# Patient Record
Sex: Male | Born: 1960 | Race: White | Hispanic: No | Marital: Married | Smoking: Former smoker
Health system: Southern US, Community
[De-identification: ages and names within clinical notes are randomized; demographics above are authoritative.]

## PROBLEM LIST (undated history)

## (undated) DIAGNOSIS — G510 Bell's palsy: Secondary | ICD-10-CM

## (undated) DIAGNOSIS — R519 Headache, unspecified: Secondary | ICD-10-CM

## (undated) DIAGNOSIS — K219 Gastro-esophageal reflux disease without esophagitis: Secondary | ICD-10-CM

## (undated) DIAGNOSIS — R51 Headache: Secondary | ICD-10-CM

## (undated) HISTORY — PX: OTHER SURGICAL HISTORY: SHX169

## (undated) HISTORY — DX: Headache: R51

## (undated) HISTORY — PX: CHOLECYSTECTOMY: SHX55

## (undated) HISTORY — DX: Gastro-esophageal reflux disease without esophagitis: K21.9

## (undated) HISTORY — DX: Bell's palsy: G51.0

## (undated) HISTORY — DX: Headache, unspecified: R51.9

---

## 1995-05-13 HISTORY — PX: BACK SURGERY: SHX140

## 2006-07-16 ENCOUNTER — Encounter: Admission: RE | Admit: 2006-07-16 | Discharge: 2006-07-16 | Payer: Self-pay | Admitting: Orthopaedic Surgery

## 2007-05-13 HISTORY — PX: MEDIAL COLLATERAL LIGAMENT REPAIR, KNEE: SHX2019

## 2011-01-02 ENCOUNTER — Ambulatory Visit: Payer: Self-pay | Admitting: Family Medicine

## 2011-01-07 ENCOUNTER — Ambulatory Visit: Payer: Self-pay | Admitting: Gastroenterology

## 2011-01-16 ENCOUNTER — Telehealth (INDEPENDENT_AMBULATORY_CARE_PROVIDER_SITE_OTHER): Payer: Self-pay

## 2011-01-16 NOTE — Telephone Encounter (Signed)
LMOM and LM at work for pt to call our office so he can get his new appt that I made for him to see DR Dwain Sarna instead of Dr Luisa Hart.[name]

## 2011-01-20 ENCOUNTER — Telehealth (INDEPENDENT_AMBULATORY_CARE_PROVIDER_SITE_OTHER): Payer: Self-pay | Admitting: General Surgery

## 2011-01-20 NOTE — Telephone Encounter (Signed)
Contacted patient and left a message on voice mail. Need to know where he had his hida scan so we can get the report for his office visit.

## 2011-01-21 ENCOUNTER — Encounter (INDEPENDENT_AMBULATORY_CARE_PROVIDER_SITE_OTHER): Payer: Self-pay | Admitting: General Surgery

## 2011-01-21 ENCOUNTER — Ambulatory Visit (INDEPENDENT_AMBULATORY_CARE_PROVIDER_SITE_OTHER): Payer: 59 | Admitting: General Surgery

## 2011-01-21 VITALS — BP 106/88 | HR 60 | Temp 98.0°F | Ht 70.0 in | Wt 186.4 lb

## 2011-01-21 DIAGNOSIS — K828 Other specified diseases of gallbladder: Secondary | ICD-10-CM | POA: Insufficient documentation

## 2011-01-21 NOTE — Progress Notes (Signed)
Chief Complaint  Patient presents with  . Other    eval gallbladder    HPI Martin Hodge is a 50 y.o. male.   HPI This is a 49 year old male who is a relative of Dr. Luisa Hart area he is otherwise very healthy and about a couple of weeks ago again having symptoms of sweats, chills, decreased energy and low-grade fevers. He began taking Tylenol after his symptoms started. He had been nauseated and having epigastric pain that radiated into his chest as well as into his right upper quadrant around his flank. Eventually his fever broke but he continued to have no abdominal pain. He describes very dark urine as well as a yellowish discoloration of his eyes. He was then evaluated by his primary care physician. He underwent a KUB which was negative as well as a urinalysis that was negative. His ultrasound room showed no real abnormalities. His laboratory evaluation however showed him to have an albumin of 4, alkaline phosphatase of 344, total bilirubin of 4.9, AST of 215, and ALT of 499. At that point he was referred to see a gastroenterologist. His repeat labs were all improved with an alkaline phosphatase of 395, total bilirubin of 1.9, AST of 114, and ALT of 414. His symptoms were slowly improving at that point to where now he is fairly normal without any of these symptoms again. He also underwent a HIDA scan which showed him to have really normal anatomy with an ejection fraction and measured below normal at 24%. Within the injection he did not have recurrence of any of his symptoms at that point. Apparently his hepatitis serologies have all been negative also. He comes in today for evaluation to consider a cholecystectomy. He does describe that he hasn't had occasional pain previously as well as what he thinks may have been some yellowish discoloration of his eyes as well. He has no other symptoms including weight loss. 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 He drinks alcohol but rarely.Past  Medical History  Diagnosis Date  . GERD (gastroesophageal reflux disease)   . Cough     Past Surgical History  Procedure Date  . Medial collateral ligament repair, knee 2009  . Back surgery 1997    disk surgery in lower back     Family History  Problem Relation Age of Onset  . Stroke Mother     Social History History  Substance Use Topics  . Smoking status: Former Smoker    Quit date: 05/23/1983  . Smokeless tobacco: Not on file  . Alcohol Use: Yes    Allergies  Allergen Reactions  . Sulfa Antibiotics     Hives, itching     Current Outpatient Prescriptions  Medication Sig Dispense Refill  . Multiple Vitamin (MULTIVITAMIN PO) Take by mouth daily.        . Saw Palmetto, Serenoa repens, (SAW PALMETTO PO) Take by mouth daily.        . cephALEXin (KEFLEX) 500 MG capsule       . predniSONE (STERAPRED UNI-PAK) 10 MG tablet       . sulfamethoxazole-trimethoprim (BACTRIM DS) 800-160 MG per tablet       . tobramycin-dexamethasone (TOBRADEX) ophthalmic solution         Review of Systems Review of Systems  Constitutional: Positive for fever, chills and fatigue.  HENT: Negative.   Eyes: Negative.   Respiratory: Positive for cough.   Cardiovascular: Negative.   Gastrointestinal: Positive for constipation.  Genitourinary: Negative.  Musculoskeletal: Negative.   Neurological: Negative.   Hematological: Negative.   Psychiatric/Behavioral: Negative.     Blood pressure 106/88, pulse 60, temperature 98 F (36.7 C), height 5\' 10"  (1.778 m), weight 186 lb 6 oz (84.539 kg).  Physical Exam Physical Exam  Constitutional: He appears well-developed and well-nourished.  Eyes: No scleral icterus.  Cardiovascular: Normal rate, regular rhythm and normal heart sounds.   Pulmonary/Chest: Effort normal and breath sounds normal. He has no wheezes. He has no rales.  Abdominal: There is no hepatomegaly. There is no tenderness. A hernia (small reducible umbilical hernia) is present.    Lymphadenopathy:    He has no cervical adenopathy.      Assessment    Likely biliary disease, biliary dyskinesia on hida scan    Plan    I reviewed his ultrasound, and evaluation by gastroenterology, and HIDA scan. When I go over his clinical course as well as review his laboratory evaluation it does appear that he has some biliary tract disease it is most consistent with stone disease. He is a hepatitis serology has been negative and there is no medical cause for him having an elevation in his bilirubin and the remainder of his enzymes. He does have a normal ultrasound but does have an abnormal HIDA scan. I discussed with him the option of observation but I think with her clinical course that he is undergone even though he has a normal ultrasound I think the most reasonable plan would be to proceed with a cholecystectomy. We did discuss the possibility of an MRCP but I think if this was negative we would still proceed with a cholecystectomy. He has no other real reason why he would have an elevation of his laboratory markers.  I discussed with him a laparoscopic cholecystectomy with a cholangiogram. We discussed the risks being but not limited to bleeding, infection, open procedure, and common bile duct injury we discussed his work restrictions as well as his long-term restrictions as well. I did specifically discussed with him there may be additional procedures by gastroenterology postoperatively as well as failure to prevent this these episodes or any pain in the future.       Martin Hodge 01/21/2011, 10:52 AM

## 2011-01-27 ENCOUNTER — Encounter (HOSPITAL_COMMUNITY)
Admission: RE | Admit: 2011-01-27 | Discharge: 2011-01-27 | Disposition: A | Discharge status: Home / Self Care | Payer: 59 | Source: Ambulatory Visit | Attending: General Surgery | Admitting: General Surgery

## 2011-01-27 ENCOUNTER — Other Ambulatory Visit (INDEPENDENT_AMBULATORY_CARE_PROVIDER_SITE_OTHER): Payer: Self-pay | Admitting: General Surgery

## 2011-01-27 DIAGNOSIS — K828 Other specified diseases of gallbladder: Secondary | ICD-10-CM

## 2011-01-27 LAB — DIFFERENTIAL
Basophils Absolute: 0 10*3/uL (ref 0.0–0.1)
Basophils Relative: 0 % (ref 0–1)
Eosinophils Relative: 4 % (ref 0–5)
Lymphocytes Relative: 28 % (ref 12–46)
Monocytes Absolute: 0.4 10*3/uL (ref 0.1–1.0)
Neutro Abs: 3.1 10*3/uL (ref 1.7–7.7)

## 2011-01-27 LAB — COMPREHENSIVE METABOLIC PANEL
AST: 52 U/L — ABNORMAL HIGH (ref 0–37)
GFR calc Af Amer: >60 mL/min (ref >60)
Glucose, Bld: 92 mg/dL (ref 70–99)
Sodium: 140 mEq/L (ref 135–145)
Total Protein: 7 g/dL (ref 6.0–8.3)

## 2011-01-27 LAB — CBC
Hemoglobin: 14.5 g/dL (ref 13.0–17.0)
MCHC: 34.3 g/dL (ref 30.0–36.0)
MCV: 86.9 fL (ref 78.0–100.0)
Platelets: 196 10*3/uL (ref 150–400)
RBC: 4.87 MIL/uL (ref 4.22–5.81)
WBC: 5.3 10*3/uL (ref 4.0–10.5)

## 2011-01-28 ENCOUNTER — Ambulatory Visit (INDEPENDENT_AMBULATORY_CARE_PROVIDER_SITE_OTHER): Payer: Self-pay | Admitting: Surgery

## 2011-02-07 ENCOUNTER — Other Ambulatory Visit (INDEPENDENT_AMBULATORY_CARE_PROVIDER_SITE_OTHER): Payer: Self-pay | Admitting: General Surgery

## 2011-02-07 ENCOUNTER — Ambulatory Visit (HOSPITAL_COMMUNITY): Payer: 59

## 2011-02-07 ENCOUNTER — Ambulatory Visit (HOSPITAL_COMMUNITY)
Admission: RE | Admit: 2011-02-07 | Discharge: 2011-02-07 | Disposition: A | Discharge status: Home / Self Care | Payer: 59 | Source: Ambulatory Visit | Attending: General Surgery | Admitting: General Surgery

## 2011-02-07 DIAGNOSIS — K429 Umbilical hernia without obstruction or gangrene: Secondary | ICD-10-CM | POA: Insufficient documentation

## 2011-02-07 DIAGNOSIS — K811 Chronic cholecystitis: Secondary | ICD-10-CM

## 2011-02-07 DIAGNOSIS — F17200 Nicotine dependence, unspecified, uncomplicated: Secondary | ICD-10-CM | POA: Insufficient documentation

## 2011-02-07 NOTE — Op Note (Signed)
NAME:  GLENDA, KUNST NO.:  [id number]  MEDICAL RECORD NO.:  [id number]  LOCATION:  [facility]                         FACILITY:  [facility]  PHYSICIAN:  Juanetta Gosling, MDDATE OF BIRTH:  [birth year]  DATE OF PROCEDURE:  02/07/2011 DATE OF DISCHARGE:                              OPERATIVE REPORT   PREOPERATIVE DIAGNOSIS:  Biliary dyskinesia.  POSTOPERATIVE DIAGNOSIS:  Chronic cholecystitis.  PROCEDURE: 1. Laparoscopic cholecystectomy with a cholangiogram. 2. Primary umbilical hernia repair with suture.  SURGEON:  Juanetta Gosling, MD  ASSISTANT:  None.  ANESTHESIA:  General.  SUPERVISING ANESTHESIOLOGIST:  Maren Beach, MD  SPECIMENS:  Gallbladder contents to pathology.  BLOOD LOSS:  Minimal.  DRAINS:  None.  COMPLICATIONS:  None.  DISPOSITION:  To recovery room in stable condition.  INDICATIONS:  A 50 year old male who has had some history of sweats, chills, decreased energy, low grade fevers, have been nauseated and also had some epigastric pain that radiated into his chest as well as in his right upper quadrant around his flank.  He at that point had an elevated bilirubin also.  He was evaluated by Gastroenterology, had a fairly normal ultrasound, but I had HIDA scan which showed normal anatomy and ejection fraction measured below normal at 24%.  His hepatitis serologies were negative per the report from the gastroenterologist and he was evaluated then by me to see if it does consider a cholecystectomy, given what may very well have been some symptoms from his gallbladder, even though he did not appear to have any stones.  He and I discussed multiple options and including observation, but I think without another compelling reason for his bilirubin being still elevated and the pain that he had, it was reasonable to proceed with laparoscopic cholecystectomy with a cholangiogram.  PROCEDURE:  After informed consent was obtained from the  patient, he was then given 1 gram of intravenous cefoxitin.  Sequential compression devices were placed on lower extremities prior to induction with anesthesia.  He was then placed under general endotracheal anesthesia without complications.  His abdomen was prepped and draped in standard sterile surgical fashion.  A surgical time-out was then performed.  I then infiltrated 0.25% Marcaine below his umbilicus.  I made a vertical incision, carried this down to his fascia.  This was entered sharply.  His peritoneum was entered with Metzenbaum scissors.  I then placed a 0 Vicryl pursestring suture through the fascia.  A Hasson trocar was introduced and insufflated 15 mmHg pressure.  Three further 5- mm ports were placed in the epigastrium right upper quadrant under direct vision after infiltration with local anesthetic without complication.  I then retracted his gallbladder cephalad.  He had a fair amount of adhesions from his duodenum to his gallbladder and these were taken down bluntly.  Eventually, I was able to retract his gallbladder cephalad and lateral.  It did show signs that it looked like he had chronic cholecystitis.  I then dissected triangle of Calot and clearly identified both the cystic duct and cystic artery obtaining the critical view of safety.  I clipped the artery and divided this.  I then clipped the duct distally, made  a ductotomy.  I milked this.  There were no stones present.  I then introduced a Cook catheter and performed a cholangiogram.  Cholangiogram showed filling of the duodenum, filling of both sides of the liver, my presence in the cystic duct and no stones that appeared to be present.  I then removed the catheter.  I clipped the duct twice and divided.  Then, I removed the gallbladder from the liver bed.  This was very adherent to his liver bed in numerous positions.  I did make a small hole in his gallbladder and it was filled with small amount of bile  during this.  The gallbladder eventually was removed.  I then placed an EndoCatch bag and removed it from the umbilicus.  Copious irrigation was performed until this was clear.  I then obtained hemostasis.  I did place a piece of snow on his gallbladder bed as it was a little friable and then evacuated all the fluid.  I then removed the Hasson trocar.  He did have a small reducible umbilical hernia preoperatively, so I lifted up his umbilicus and then had about a 3-mm umbilical hernia that I fixed with a figure-of-eight Ethibond suture.  I then tacked his umbilicus down with a 3-0 undyed Vicryl.  I looked through my epigastric port and there was no entry injury that was present.  I then desufflated the abdomen and removed all ports.  I then closed these incisions with 4-0 Monocryl and Dermabond. He tolerated this well, was extubated and transferred to recovery room in stable condition.     Juanetta Gosling, MD     MCW/MEDQ  D:  02/07/2011  T:  02/07/2011  Job:  [id number]  cc:   Jerl Mina, MD  Electronically Signed by Emelia Loron MD on 02/07/2011 05:30:49 PM

## 2011-02-10 ENCOUNTER — Other Ambulatory Visit (INDEPENDENT_AMBULATORY_CARE_PROVIDER_SITE_OTHER): Payer: Self-pay | Admitting: General Surgery

## 2011-02-10 DIAGNOSIS — R9389 Abnormal findings on diagnostic imaging of other specified body structures: Secondary | ICD-10-CM

## 2011-02-13 ENCOUNTER — Telehealth (INDEPENDENT_AMBULATORY_CARE_PROVIDER_SITE_OTHER): Payer: Self-pay | Admitting: General Surgery

## 2011-02-13 ENCOUNTER — Other Ambulatory Visit (INDEPENDENT_AMBULATORY_CARE_PROVIDER_SITE_OTHER): Payer: Self-pay | Admitting: General Surgery

## 2011-02-13 ENCOUNTER — Ambulatory Visit (HOSPITAL_COMMUNITY)
Admission: RE | Admit: 2011-02-13 | Discharge: 2011-02-13 | Disposition: A | Discharge status: Home / Self Care | Payer: 59 | Source: Ambulatory Visit | Attending: General Surgery | Admitting: General Surgery

## 2011-02-13 ENCOUNTER — Encounter (HOSPITAL_COMMUNITY): Payer: Self-pay

## 2011-02-13 DIAGNOSIS — R9389 Abnormal findings on diagnostic imaging of other specified body structures: Secondary | ICD-10-CM

## 2011-02-13 MED ORDER — IOHEXOL 300 MG/ML  SOLN
80.0000 mL | Freq: Once | INTRAMUSCULAR | Status: AC | PRN
  Administered 2011-02-13: 80 mL via INTRAVENOUS

## 2011-02-13 NOTE — Telephone Encounter (Signed)
Call received from Kim at [facility] radiology [phone], order needs to be changed to with contrast. Changed order

## 2011-02-14 ENCOUNTER — Telehealth (INDEPENDENT_AMBULATORY_CARE_PROVIDER_SITE_OTHER): Payer: Self-pay

## 2011-02-14 NOTE — Telephone Encounter (Signed)
Called pt to notify him that his tests are all normal per Dr Dwain Sarna.[name]

## 2011-02-24 ENCOUNTER — Encounter (INDEPENDENT_AMBULATORY_CARE_PROVIDER_SITE_OTHER): Payer: Self-pay | Admitting: General Surgery

## 2011-02-24 ENCOUNTER — Ambulatory Visit (INDEPENDENT_AMBULATORY_CARE_PROVIDER_SITE_OTHER): Payer: 59 | Admitting: General Surgery

## 2011-02-24 VITALS — BP 122/78 | HR 66 | Temp 97.2°F | Resp 16 | Ht 70.0 in | Wt 190.0 lb

## 2011-02-24 DIAGNOSIS — Z09 Encounter for follow-up examination after completed treatment for conditions other than malignant neoplasm: Secondary | ICD-10-CM

## 2011-02-24 NOTE — Progress Notes (Signed)
Subjective:     Patient ID: JOAB CARDEN, male   DOB: 1960/07/22, 51 y.o.   MRN: [id number]  HPI This is a 50 year old male who I saw for biliary dyskinesia and history of elevated liver function tests who we did a laparoscopic cholecystectomy with a normal cholangiogram on recently. He has done well postoperatively he returns today without any significant complaints. I also did a primary umbilical hernia repair at the same time. He had an abnormal chest x-ray preoperatively which was followed by a chest CT scan which was negative.  Review of Systems *RADIOLOGY REPORT*  Clinical Data: Right upper lobe pulmonary nodule seen on chest  radiograph. Productive cough.  CT CHEST WITH CONTRAST  Technique: Multidetector CT imaging of the chest was performed  following the standard protocol during bolus administration of  intravenous contrast.  Contrast: 80mL OMNIPAQUE IOHEXOL 300 MG/ML IV SOLN  Comparison: Chest radiograph on 01/27/2011  Findings: No suspicious pulmonary nodules or masses are seen in the  right upper lobe or elsewhere within either lung. There is no  evidence of pulmonary infiltrate or endobronchial lesion. Mild  biapical scarring noted. No evidence of pleural or pericardial  effusion.  There is no evidence of hilar or mediastinal masses. No adenopathy  seen elsewhere within the thorax. Both adrenal glands are normal  in appearance.  IMPRESSION:  No active disease within the thorax. No evidence of mass or  lymphadenopathy.     Objective:   Physical Exam Well healed incisions without infection    Assessment:     S/p lap chole    Plan:     We discussed the CT scan results. I release him to full activity and told him he can begin doing whatever he wants and increased at slowly. I am going to see him as needed and he will call me if he needs anything.We discussed pathology being mild chronic cholecystitis.

## 2011-11-13 IMAGING — RF DG CHOLANGIOGRAM OPERATIVE
1 series · 6 of 6 positions shown · non-contrast
Comparison: None.

CLINICAL DATA: Cholecystectomy.

INTRAOPERATIVE CHOLANGIOGRAM
TECHNIQUE: Cholangiographic images from the C-arm fluoroscopic
device were submitted for interpretation post-operatively.  Please
see the procedural report for the amount of contrast and the
fluoroscopy time utilized.

[Series 1: run · 3 acquisitions, 6 frames shown]
[im 1/3]
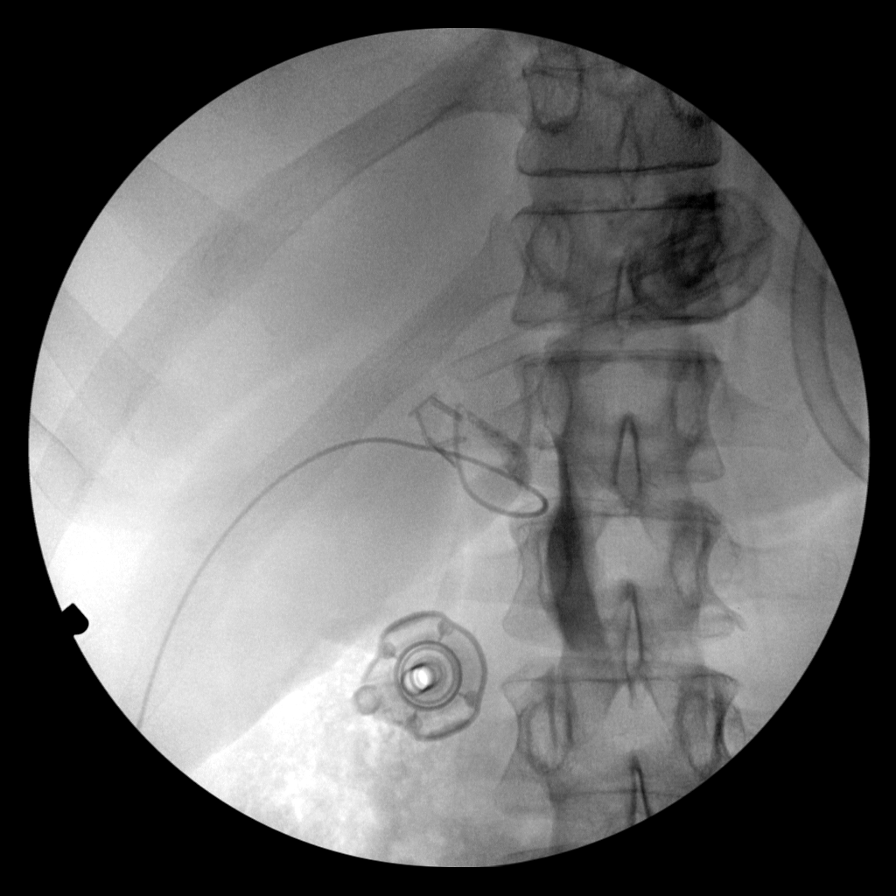
[im 1/3]
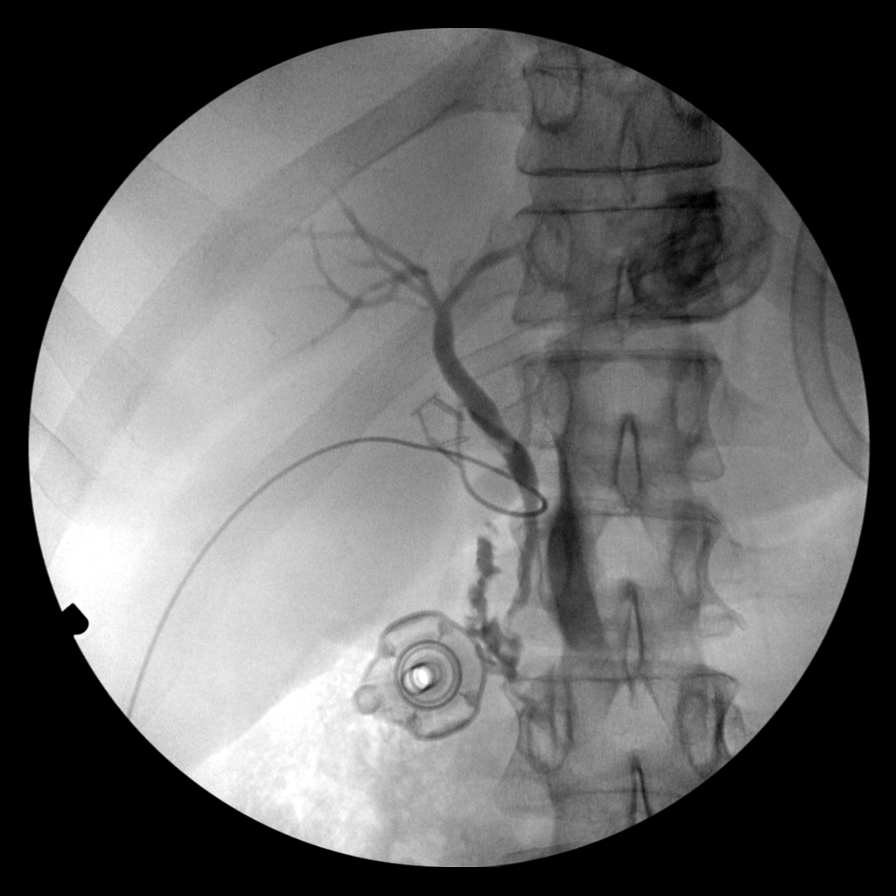
[im 1/3]
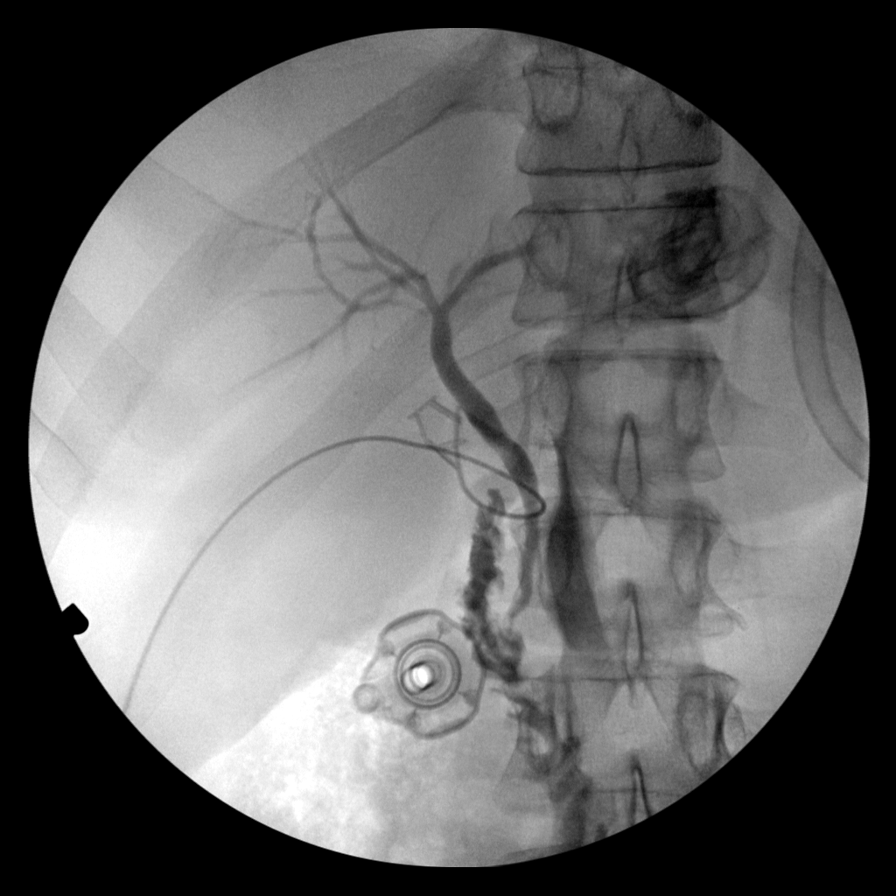
[im 1/3]
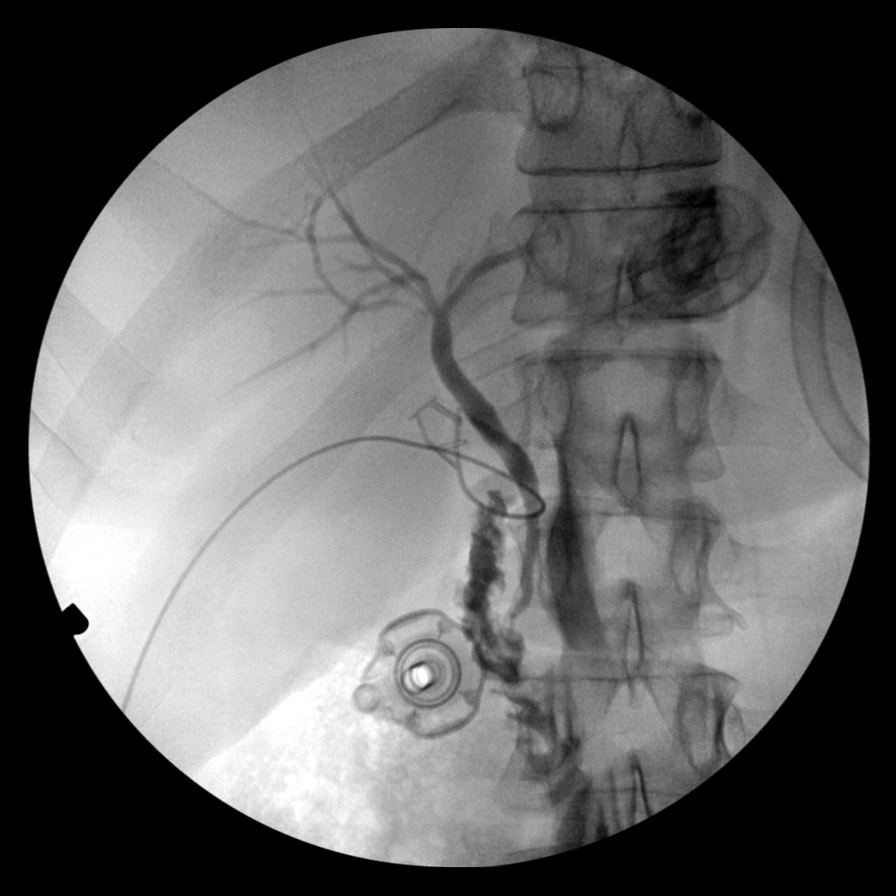
[im 2/3]
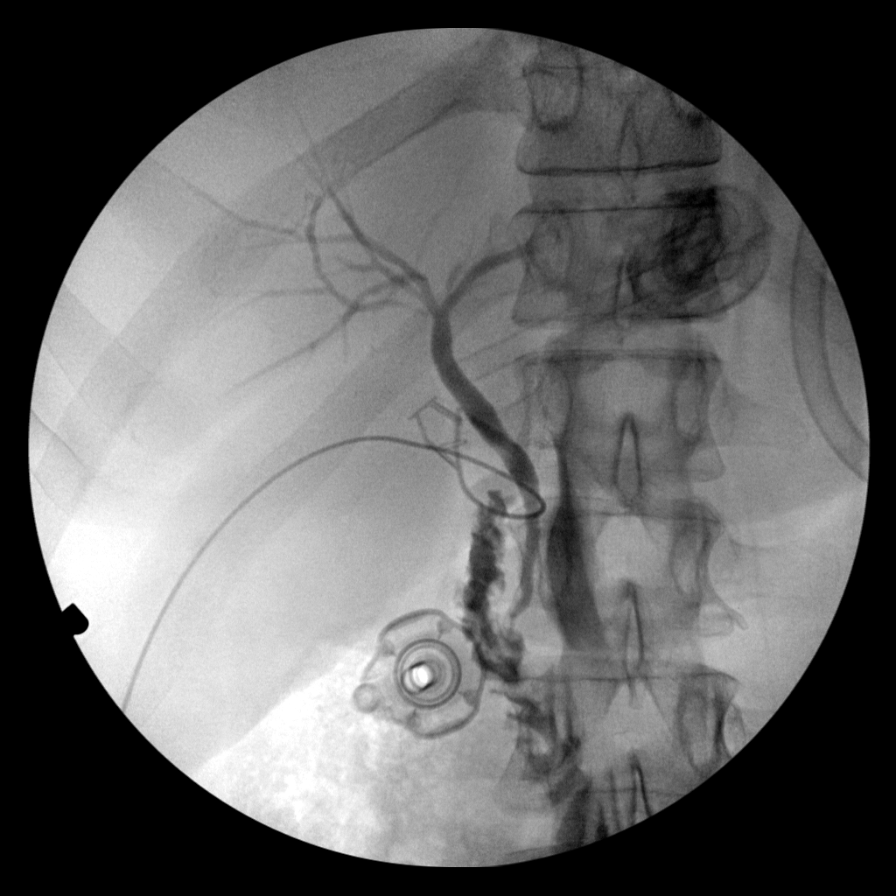
[im 3/3]
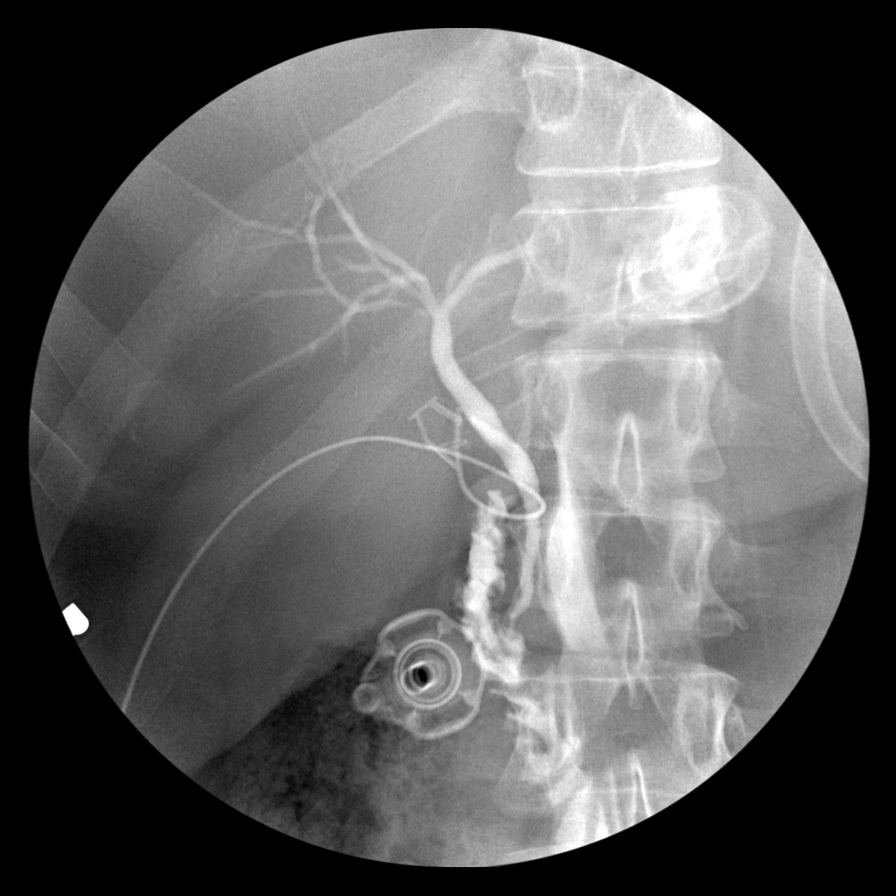

[6 of 6 positions shown; findings below may reference images not displayed]

FINDINGS: Opacification of the cystic duct, common bile duct and
the central intrahepatic ducts.  Contrast drains into the duodenum.
There are filling defects in the upper common bile duct that are
probably related to gas bubbles.  The biliary system is not
dilated.
IMPRESSION: The biliary system is patent without dilatation.

Small filling defects within the common bile duct are most likely
related to gas.  No evidence for an obstructing lesion.

## 2013-01-21 ENCOUNTER — Ambulatory Visit: Payer: Self-pay | Admitting: Unknown Physician Specialty

## 2016-11-26 ENCOUNTER — Observation Stay
Admission: EM | Admit: 2016-11-26 | Discharge: 2016-11-27 | Disposition: A | Discharge status: Home / Self Care | Payer: 59 | Attending: Internal Medicine | Admitting: Internal Medicine

## 2016-11-26 ENCOUNTER — Observation Stay
Admit: 2016-11-26 | Discharge: 2016-11-26 | Disposition: A | Discharge status: Home / Self Care | Payer: 59 | Attending: Internal Medicine | Admitting: Internal Medicine

## 2016-11-26 ENCOUNTER — Emergency Department: Payer: 59

## 2016-11-26 ENCOUNTER — Encounter: Payer: Self-pay | Admitting: Emergency Medicine

## 2016-11-26 DIAGNOSIS — R202 Paresthesia of skin: Secondary | ICD-10-CM

## 2016-11-26 DIAGNOSIS — G510 Bell's palsy: Secondary | ICD-10-CM | POA: Diagnosis not present

## 2016-11-26 DIAGNOSIS — Z905 Acquired absence of kidney: Secondary | ICD-10-CM | POA: Insufficient documentation

## 2016-11-26 DIAGNOSIS — R29898 Other symptoms and signs involving the musculoskeletal system: Secondary | ICD-10-CM

## 2016-11-26 DIAGNOSIS — E785 Hyperlipidemia, unspecified: Secondary | ICD-10-CM | POA: Insufficient documentation

## 2016-11-26 DIAGNOSIS — K219 Gastro-esophageal reflux disease without esophagitis: Secondary | ICD-10-CM | POA: Diagnosis not present

## 2016-11-26 DIAGNOSIS — Z87891 Personal history of nicotine dependence: Secondary | ICD-10-CM | POA: Diagnosis not present

## 2016-11-26 DIAGNOSIS — R200 Anesthesia of skin: Secondary | ICD-10-CM | POA: Diagnosis present

## 2016-11-26 DIAGNOSIS — R531 Weakness: Secondary | ICD-10-CM | POA: Diagnosis not present

## 2016-11-26 DIAGNOSIS — R29810 Facial weakness: Secondary | ICD-10-CM

## 2016-11-26 DIAGNOSIS — N183 Chronic kidney disease, stage 3 (moderate): Secondary | ICD-10-CM | POA: Insufficient documentation

## 2016-11-26 DIAGNOSIS — Z823 Family history of stroke: Secondary | ICD-10-CM | POA: Diagnosis not present

## 2016-11-26 DIAGNOSIS — N289 Disorder of kidney and ureter, unspecified: Secondary | ICD-10-CM

## 2016-11-26 LAB — PROTIME-INR
INR: 0.94
PROTHROMBIN TIME: 12.6 s (ref 11.4–15.2)

## 2016-11-26 LAB — BASIC METABOLIC PANEL
ANION GAP: 8 (ref 5–15)
BUN: 26 mg/dL — ABNORMAL HIGH (ref 6–20)
CALCIUM: 9.4 mg/dL (ref 8.9–10.3)
CHLORIDE: 108 mmol/L (ref 101–111)
CO2: 24 mmol/L (ref 22–32)
Creatinine, Ser: 1.6 mg/dL — ABNORMAL HIGH (ref 0.61–1.24)
GFR calc non Af Amer: 47 mL/min — ABNORMAL LOW (ref >60)
GFR, EST AFRICAN AMERICAN: 54 mL/min — AB (ref >60)
Glucose, Bld: 112 mg/dL — ABNORMAL HIGH (ref 65–99)
POTASSIUM: 4.5 mmol/L (ref 3.5–5.1)
Sodium: 140 mmol/L (ref 135–145)

## 2016-11-26 LAB — URINALYSIS, COMPLETE (UACMP) WITH MICROSCOPIC
BACTERIA UA: NONE SEEN
BILIRUBIN URINE: NEGATIVE
Glucose, UA: NEGATIVE mg/dL
HGB URINE DIPSTICK: NEGATIVE
Ketones, ur: NEGATIVE mg/dL
LEUKOCYTES UA: NEGATIVE
NITRITE: NEGATIVE
Protein, ur: NEGATIVE mg/dL
RBC / HPF: NONE SEEN RBC/hpf (ref 0–5)
SPECIFIC GRAVITY, URINE: 1.028 (ref 1.005–1.030)
pH: 6 (ref 5.0–8.0)

## 2016-11-26 LAB — CBC
HEMATOCRIT: 41.9 % (ref 40.0–52.0)
HEMOGLOBIN: 14.2 g/dL (ref 13.0–18.0)
MCH: 30.5 pg (ref 26.0–34.0)
MCHC: 33.9 g/dL (ref 32.0–36.0)
MCV: 89.8 fL (ref 80.0–100.0)
Platelets: 206 10*3/uL (ref 150–440)
RBC: 4.66 MIL/uL (ref 4.40–5.90)
RDW: 13.5 % (ref 11.5–14.5)
WBC: 4.9 10*3/uL (ref 3.8–10.6)

## 2016-11-26 LAB — URINE DRUG SCREEN, QUALITATIVE (ARMC ONLY)
Amphetamines, Ur Screen: NOT DETECTED
BARBITURATES, UR SCREEN: NOT DETECTED
Benzodiazepine, Ur Scrn: NOT DETECTED
CANNABINOID 50 NG, UR ~~LOC~~: NOT DETECTED
COCAINE METABOLITE, UR ~~LOC~~: NOT DETECTED
MDMA (Ecstasy)Ur Screen: NOT DETECTED
Methadone Scn, Ur: NOT DETECTED
Opiate, Ur Screen: NOT DETECTED
Phencyclidine (PCP) Ur S: NOT DETECTED
TRICYCLIC, UR SCREEN: NOT DETECTED

## 2016-11-26 LAB — APTT: aPTT: 29 seconds (ref 24–36)

## 2016-11-26 LAB — ETHANOL: Alcohol, Ethyl (B): <5 mg/dL (ref <5)

## 2016-11-26 LAB — ECHOCARDIOGRAM COMPLETE
Height: 70 in
WEIGHTICAEL: 3212.8 [oz_av]

## 2016-11-26 MED ORDER — SODIUM CHLORIDE 0.9 % IV SOLN
INTRAVENOUS | Status: DC
  Administered 2016-11-26 (×2): via INTRAVENOUS

## 2016-11-26 MED ORDER — SODIUM CHLORIDE 0.9 % IV BOLUS (SEPSIS)
1000.0000 mL | Freq: Once | INTRAVENOUS | Status: AC
  Administered 2016-11-26: 1000 mL via INTRAVENOUS

## 2016-11-26 MED ORDER — ASPIRIN 325 MG PO TABS
325.0000 mg | ORAL_TABLET | Freq: Every day | ORAL | Status: DC
  Administered 2016-11-26 – 2016-11-27 (×2): 325 mg via ORAL
  Filled 2016-11-26 (×2): qty 1

## 2016-11-26 MED ORDER — IOPAMIDOL (ISOVUE-370) INJECTION 76%
75.0000 mL | Freq: Once | INTRAVENOUS | Status: AC | PRN
  Administered 2016-11-26: 75 mL via INTRAVENOUS

## 2016-11-26 MED ORDER — ASPIRIN 300 MG RE SUPP: 300.0000 mg | Freq: Every day | RECTAL | Status: DC

## 2016-11-26 MED ORDER — ATORVASTATIN CALCIUM 20 MG PO TABS
40.0000 mg | ORAL_TABLET | Freq: Every day | ORAL | Status: DC
  Administered 2016-11-26: 40 mg via ORAL
  Filled 2016-11-26: qty 2

## 2016-11-26 MED ORDER — HEPARIN SODIUM (PORCINE) 5000 UNIT/ML IJ SOLN
5000.0000 [IU] | Freq: Three times a day (TID) | INTRAMUSCULAR | Status: DC
  Filled 2016-11-26 (×2): qty 1

## 2016-11-26 MED ORDER — ACETAMINOPHEN 650 MG RE SUPP
650.0000 mg | RECTAL | Status: DC | PRN
Start: 2016-11-26 — End: 2016-11-27

## 2016-11-26 MED ORDER — STROKE: EARLY STAGES OF RECOVERY BOOK
Freq: Once | Status: AC
  Administered 2016-11-26: 13:00:00

## 2016-11-26 MED ORDER — SENNOSIDES-DOCUSATE SODIUM 8.6-50 MG PO TABS: 1.0000 | ORAL_TABLET | Freq: Every evening | ORAL | Status: DC | PRN

## 2016-11-26 MED ORDER — ACETAMINOPHEN 325 MG PO TABS
650.0000 mg | ORAL_TABLET | ORAL | Status: DC | PRN
  Administered 2016-11-26: 650 mg via ORAL
  Filled 2016-11-26: qty 2

## 2016-11-26 MED ORDER — ACETAMINOPHEN 160 MG/5ML PO SOLN
650.0000 mg | ORAL | Status: DC | PRN
  Filled 2016-11-26: qty 20.3

## 2016-11-26 MED ORDER — OXYCODONE HCL 5 MG PO TABS
5.0000 mg | ORAL_TABLET | Freq: Once | ORAL | Status: AC
  Administered 2016-11-26: 5 mg via ORAL
  Filled 2016-11-26: qty 1

## 2016-11-26 NOTE — Progress Notes (Signed)
CH responded to a consult for AD. CH met with pt, pt's wife had just stepped out of the Rm at the time. [place] educated pt on how to complete AD, pt to review the AD and will contact [place] when ready to complete it. Pt is informed that [place] would be available if needed.   11/26/16 1400  Clinical Encounter Type  Visited With Patient  Visit Type Initial;Other (Comment)  Referral From Nurse  Consult/Referral To Chaplain  Spiritual Encounters  Spiritual Needs Literature

## 2016-11-26 NOTE — Progress Notes (Signed)
*  PRELIMINARY RESULTS* Echocardiogram 2D Echocardiogram has been performed.  Cristela BlueHege, Osmin Welz 11/26/2016, 3:21 PM

## 2016-11-26 NOTE — ED Notes (Signed)
Attempted to call report x 1  

## 2016-11-26 NOTE — ED Notes (Signed)
Dr. Chen at bedside.

## 2016-11-26 NOTE — ED Triage Notes (Signed)
Pt c/o headache X 4 days and facial numbness to right face X 2 days. Decreased sensation to right arm. Right facial droop noted. No significant medical hx.

## 2016-11-26 NOTE — H&P (Addendum)
[facility] [place] [facility]   PATIENT NAME: Martin Hodge    MR#:  [id number]  DATE OF BIRTH:  June 30, 1960  DATE OF ADMISSION:  11/26/2016  PRIMARY CARE PHYSICIAN: Martin Mina, MD   REQUESTING/REFERRING PHYSICIAN: Rockne Menghini, MD  CHIEF COMPLAINT:   Chief Complaint  Patient presents with  . Numbness   Right arm numbness and right facial droop 3 days. HISTORY OF PRESENT ILLNESS:  English Martin Hodge  is a 56 y.o. male with a known history of GERD and left kidney donation. The patient to present to the ED with the above chief complaints. The patient developed right-sided headache with radiation to the neck and sensation of ear fullness 4 days ago. Then he developed right-sided facial droop associated with numbness of the face and the right arm numbness. He denies any dizziness, loss of consciousness, incontinence or seizure. CT angiogram of the brain didn't show any acute CVA or vessel stenosis. ED physician Dr. Sharma Covert discussed with neurologist Dr. Thad Ranger, who suggested admitting patient for further workup.  PAST MEDICAL HISTORY:   Past Medical History:  Diagnosis Date  . GERD (gastroesophageal reflux disease)     PAST SURGICAL HISTORY:   Past Surgical History:  Procedure Laterality Date  . BACK SURGERY  1997   disk surgery in lower back   . CHOLECYSTECTOMY    . left kidney donatioin Left   . MEDIAL COLLATERAL LIGAMENT REPAIR, KNEE  2009    SOCIAL HISTORY:   Social History  Substance Use Topics  . Smoking status: Former Smoker    Quit date: 05/23/1983  . Smokeless tobacco: Never Used  . Alcohol use Yes    FAMILY HISTORY:   Family History  Problem Relation Age of Onset  . Stroke Mother   . Heart disease Father     DRUG ALLERGIES:   Allergies  Allergen Reactions  . Ibuprofen Other (See Comments)    Donated kidney in 2015.  Unable to take Ibuprofen.  . Sulfa Antibiotics     Hives, itching     REVIEW OF SYSTEMS:   Review  of Systems  Constitutional: Negative for chills, fever and malaise/fatigue.  HENT: Negative for sore throat.   Eyes: Negative for blurred vision and double vision.  Respiratory: Negative for cough, shortness of breath and stridor.   Cardiovascular: Negative for chest pain, palpitations and leg swelling.  Gastrointestinal: Negative for abdominal pain, blood in stool, diarrhea, heartburn, nausea and vomiting.  Genitourinary: Negative for dysuria and hematuria.  Musculoskeletal: Negative for back pain.  Skin: Negative for rash.  Neurological: Positive for sensory change and headaches. Negative for dizziness, tingling, tremors, speech change, focal weakness, seizures, loss of consciousness and weakness.  Psychiatric/Behavioral: Negative for depression. The patient is not nervous/anxious.        Stress from work    MEDICATIONS AT HOME:   Prior to Admission medications   Medication Sig Start Date End Date Taking? Authorizing Provider  Multiple Vitamin (MULTIVITAMIN PO) Take 1 tablet by mouth daily.    Yes [provider]  Saw Palmetto, Serenoa repens, (SAW PALMETTO PO) Take 1 tablet by mouth daily.    Yes [provider]      VITAL SIGNS:  Blood pressure (!) 133/94, pulse 69, temperature 97.8 F (36.6 C), temperature source Oral, resp. rate 16, height 5\' 10"  (1.778 m), weight 198 lb (89.8 kg), SpO2 100 %.  PHYSICAL EXAMINATION:  Physical Exam  GENERAL:  56 y.o.-year-old patient lying in the bed  with no acute distress.  EYES: Pupils equal, round, reactive to light and accommodation. No scleral icterus. Extraocular muscles intact.  HEENT: Head atraumatic, normocephalic. Oropharynx and nasopharynx clear.  NECK:  Supple, no jugular venous distention. No thyroid enlargement, no tenderness.  LUNGS: Normal breath sounds bilaterally, no wheezing, rales,rhonchi or crepitation. No use of accessory muscles of respiration.  CARDIOVASCULAR: S1, S2 normal. No murmurs, rubs, or  gallops.  ABDOMEN: Soft, nontender, nondistended. Bowel sounds present. No organomegaly or mass.  EXTREMITIES: No pedal edema, cyanosis, or clubbing.  NEUROLOGIC: Mild right facial droop. Muscle strength 5/5 in all extremities. Sensation intact. Gait not checked.  PSYCHIATRIC: The patient is alert and oriented x 3.  SKIN: No obvious rash, lesion, or ulcer.   LABORATORY PANEL:   CBC  Recent Labs Lab 11/26/16 0747  WBC 4.9  HGB 14.2  HCT 41.9  PLT 206   ------------------------------------------------------------------------------------------------------------------  Chemistries   Recent Labs Lab 11/26/16 0747  NA 140  K 4.5  CL 108  CO2 24  GLUCOSE 112*  BUN 26*  CREATININE 1.60*  CALCIUM 9.4   ------------------------------------------------------------------------------------------------------------------  Cardiac Enzymes No results for input(s): TROPONINI in the last 168 hours. ------------------------------------------------------------------------------------------------------------------  RADIOLOGY:  Ct Angio Head W Or Wo Contrast  Result Date: 11/26/2016 CLINICAL DATA:  Headache for 4 days. Facial numbness on the right for 2 days. Right facial droop. EXAM: CT ANGIOGRAPHY HEAD AND NECK TECHNIQUE: Multidetector CT imaging of the head and neck was performed using the standard protocol during bolus administration of intravenous contrast. Multiplanar CT image reconstructions and MIPs were obtained to evaluate the vascular anatomy. Carotid stenosis measurements (when applicable) are obtained utilizing NASCET criteria, using the distal internal carotid diameter as the denominator. CONTRAST:  75 cc Isovue 370 intravenous COMPARISON:  None. FINDINGS: CT HEAD FINDINGS Brain: Mild patchy low-density in the cerebral white matter, greatest in the anterior limbs of the internal capsule. This changes are nonspecific, often from chronic microvascular ischemia. No hemorrhage,  hydrocephalus, or evidence of gray matter infarct. Vascular: See below Skull: Negative Sinuses: Minimal fluid layering in the left maxillary sinus without associated fluid level. Orbits: Negative Review of the MIP images confirms the above findings CTA NECK FINDINGS Aortic arch: Normal diameter and appearance.  Four vessel branching. Right carotid system: Minimal plaque seen at the ICA bulb. No stenosis, ulceration, or dissection. Left carotid system: Minimal if any plaque at the ICA bulb. No stenosis, ulceration, or dissection. Vertebral arteries: No proximal subclavian stenosis on the right. The left vertebral artery arises from the arch. Right vertebral artery dominance. Vessels are smooth and widely patent. Skeleton: No acute or aggressive finding Other neck: No incidental mass or inflammation. No findings in the parotid or along the skullbase foramina. Upper chest: Negative Review of the MIP images confirms the above findings CTA HEAD FINDINGS Anterior circulation: Vessels are smooth and widely patent. Negative for aneurysm or atheromatous changes. Posterior circulation: Fetal type right PCA. Right vertebral artery dominance. Vessels are smooth and widely patent. Negative for aneurysm. Venous sinuses: Widely patent Anatomic variants: Right fetal type PCA Delayed phase: No abnormal intracranial enhancement. No findings in the brainstem, CP angle cisterns, mastoids, or skullbase foramina to explain right facial weakness and numbness. Review of the MIP images confirms the above findings IMPRESSION: 1. No acute finding or explanation for symptoms. No stenosis, occlusion, or dissection. Atheromatous changes are minimal. 2. Mild cerebral white matter disease, nonspecific but often from chronic microvascular ischemia if there are risk factors. Electronically Signed  By: Marnee Spring M.D.   On: 11/26/2016 09:10   Ct Angio Neck W And/or Wo Contrast  Result Date: 11/26/2016 CLINICAL DATA:  Headache for 4 days.  Facial numbness on the right for 2 days. Right facial droop. EXAM: CT ANGIOGRAPHY HEAD AND NECK TECHNIQUE: Multidetector CT imaging of the head and neck was performed using the standard protocol during bolus administration of intravenous contrast. Multiplanar CT image reconstructions and MIPs were obtained to evaluate the vascular anatomy. Carotid stenosis measurements (when applicable) are obtained utilizing NASCET criteria, using the distal internal carotid diameter as the denominator. CONTRAST:  75 cc Isovue 370 intravenous COMPARISON:  None. FINDINGS: CT HEAD FINDINGS Brain: Mild patchy low-density in the cerebral white matter, greatest in the anterior limbs of the internal capsule. This changes are nonspecific, often from chronic microvascular ischemia. No hemorrhage, hydrocephalus, or evidence of gray matter infarct. Vascular: See below Skull: Negative Sinuses: Minimal fluid layering in the left maxillary sinus without associated fluid level. Orbits: Negative Review of the MIP images confirms the above findings CTA NECK FINDINGS Aortic arch: Normal diameter and appearance.  Four vessel branching. Right carotid system: Minimal plaque seen at the ICA bulb. No stenosis, ulceration, or dissection. Left carotid system: Minimal if any plaque at the ICA bulb. No stenosis, ulceration, or dissection. Vertebral arteries: No proximal subclavian stenosis on the right. The left vertebral artery arises from the arch. Right vertebral artery dominance. Vessels are smooth and widely patent. Skeleton: No acute or aggressive finding Other neck: No incidental mass or inflammation. No findings in the parotid or along the skullbase foramina. Upper chest: Negative Review of the MIP images confirms the above findings CTA HEAD FINDINGS Anterior circulation: Vessels are smooth and widely patent. Negative for aneurysm or atheromatous changes. Posterior circulation: Fetal type right PCA. Right vertebral artery dominance. Vessels are  smooth and widely patent. Negative for aneurysm. Venous sinuses: Widely patent Anatomic variants: Right fetal type PCA Delayed phase: No abnormal intracranial enhancement. No findings in the brainstem, CP angle cisterns, mastoids, or skullbase foramina to explain right facial weakness and numbness. Review of the MIP images confirms the above findings IMPRESSION: 1. No acute finding or explanation for symptoms. No stenosis, occlusion, or dissection. Atheromatous changes are minimal. 2. Mild cerebral white matter disease, nonspecific but often from chronic microvascular ischemia if there are risk factors. Electronically Signed   By: Marnee Spring M.D.   On: 11/26/2016 09:10      IMPRESSION AND PLAN:   Right facial droop and right arm weakness. The patient will be placed for observation, follow up MRI of the brain, echocardiograph in the neurology consult. Neuro check, lipid panel and hemoglobin A1c. Start aspirin and Lipitor.  CKD stage 3. Creatinine baseline is 1.4 per patient, now creatinine is up to 1.6, start NS iv, follow-up BMP. GERD. GI cocktail when necessary.   All the records are reviewed and case discussed with ED provider. Management plans discussed with the patient, his wife and they are in agreement.  CODE STATUS:   TOTAL TIME TAKING CARE OF THIS PATIENT: 52 minutes.    Shaune Pollack M.D on 11/26/2016 at 11:00 AM  Between 7am to 6pm - Pager - [phone]  After 6pm go to [email or web address] - Social research officer, government  [facility]  Office  [phone]  CC: Primary care physician; Martin Mina, MD   Note: This dictation was prepared with Dragon dictation along with smaller phrase technology. Any transcriptional errors that result from this process are  unintentional. 

## 2016-11-26 NOTE — ED Provider Notes (Addendum)
[facility] Emergency Department Provider Note  ____________________________________________  Time seen: Approximately 7:49 AM  I have reviewed the triage vital signs and the nursing notes.   HISTORY  Chief Complaint Numbness    HPI Martin Hodge is a 56 y.o. male , RH, otherwise healthy, presenting with right facial droop and numbness, right upper extremity weakness and tingling. The patient reports that 4 days ago he developed a right-sided headache that radiated into the neck with a sensation of ear fullness. 3 days ago, he then developed a right-sided facial droop associated with numbness of the face. He went to urgent care and was told he had a small amount of fluid behind the ear and that this may be related to a sinus infection that did not require antibiotic treatment. His symptoms have persisted. He denies any visual changes, speech changes, mental status changes, difficulty walking. He denies any cough or cold symptoms, congestion or rhinorrhea, fever or chills.  SH: Denies tobacco and cocaine.  FH: CVA; no history of brain malignancy.   Past Medical History:  Diagnosis Date  . GERD (gastroesophageal reflux disease)     Patient Active Problem List   Diagnosis Date Noted  . Biliary dyskinesia 01/21/2011    Past Surgical History:  Procedure Laterality Date  . BACK SURGERY  1997   disk surgery in lower back   . CHOLECYSTECTOMY    . MEDIAL COLLATERAL LIGAMENT REPAIR, KNEE  2009    Current Outpatient Rx  . Order #: [id number] Class: Historical Med  . Order #: [id number] Class: Historical Med    Allergies Ibuprofen and Sulfa antibiotics  Family History  Problem Relation Age of Onset  . Stroke Mother     Social History Social History  Substance Use Topics  . Smoking status: Former Smoker    Quit date: 05/23/1983  . Smokeless tobacco: Never Used  . Alcohol use Yes    Review of Systems Constitutional: No fever/chills.No lightheadedness  or syncope. Eyes: No visual changes. No blurred or double vision. ENT: No sore throat. No congestion or rhinorrhea. Cardiovascular: Denies chest pain. Denies palpitations. Respiratory: Denies shortness of breath.  No cough. Gastrointestinal: No abdominal pain.  No nausea, no vomiting.  No diarrhea.  No constipation. Genitourinary: Negative for dysuria. Musculoskeletal: Negative for back pain. Skin: Negative for rash. Neurological: Positive for headache. Positive for right facial droop. Positive for right facial numbness. Positive for right upper extremity weakness and tingling. No difficulty walking. No changes in vision or speech. No changes in mentation..     ____________________________________________   PHYSICAL EXAM:  VITAL SIGNS: ED Triage Vitals [11/26/16 0734]  Enc Vitals Group     BP (!) 138/94     Pulse Rate 77     Resp 16     Temp 97.8 F (36.6 C)     Temp Source Oral     SpO2 98 %     Weight 198 lb (89.8 kg)     Height 5\' 10"  (1.778 m)     Head Circumference      Peak Flow      Pain Score 4     Pain Loc      Pain Edu?      Excl. in GC?     Constitutional: Alert and oriented. Well appearing and in no acute distress. Answers questions appropriately. Eyes: Conjunctivae are normal.  EOMI. No scleral icterus. EARS: TMs are clear bilaterally. The canals are clear as well. Head: Atraumatic. Nose: No congestion/rhinnorhea.  Mouth/Throat: Mucous membranes are moist.  Neck: No stridor.  Supple.  No JVD. No meningismus. Cardiovascular: Normal rate, regular rhythm. No murmurs, rubs or gallops.  Respiratory: Normal respiratory effort.  No accessory muscle use or retractions. Lungs CTAB.  No wheezes, rales or ronchi. Gastrointestinal: Soft, nontender and nondistended.  No guarding or rebound.  No peritoneal signs. Musculoskeletal: No LE edema. No ttp in the calves or palpable cords.  Negative Homan's sign. Neurologic: Alert and oriented 3. Speech is clear. Face and  smile are asymmetric with a right facial droop. Positive loss of the right nasolabial fold.. Tongue is midline. Unable to perform cheek puff. No pronator drift. 5 out of 5 grip, biceps, triceps, hip flexors, plantar flexion and dorsiflexion. Normal sensation to light touch in the bilateral upper and bilateral lower extremities. Decreased sensation on the right lower face with normal sensation of the forehead. Normal finger-nose-finger without ataxia. Skin:  Skin is warm, dry and intact. No rash noted. Psychiatric: Mood and affect are normal. Speech and behavior are normal.  Normal judgement.  ____________________________________________   LABS (all labs ordered are listed, but only abnormal results are displayed)  Labs Reviewed  BASIC METABOLIC PANEL - Abnormal; Notable for the following:       Result Value   Glucose, Bld 112 (*)    BUN 26 (*)    Creatinine, Ser 1.60 (*)    GFR calc non Af Amer 47 (*)    GFR calc Af Amer 54 (*)    All other components within normal limits  CBC  ETHANOL  PROTIME-INR  APTT  URINALYSIS, COMPLETE (UACMP) WITH MICROSCOPIC  URINE DRUG SCREEN, QUALITATIVE ([facility] ONLY)   ____________________________________________  EKG  ED ECG REPORT I, Rockne Menghini, the attending physician, personally viewed and interpreted this ECG.   Date: 11/26/2016  EKG Time: 747  Rate: 80  Rhythm: normal sinus rhythm  Axis: leftward  Intervals:none  ST&T Change: No STEMI  ____________________________________________  RADIOLOGY  Ct Angio Head W Or Wo Contrast  Result Date: 11/26/2016 CLINICAL DATA:  Headache for 4 days. Facial numbness on the right for 2 days. Right facial droop. EXAM: CT ANGIOGRAPHY HEAD AND NECK TECHNIQUE: Multidetector CT imaging of the head and neck was performed using the standard protocol during bolus administration of intravenous contrast. Multiplanar CT image reconstructions and MIPs were obtained to evaluate the vascular anatomy. Carotid  stenosis measurements (when applicable) are obtained utilizing NASCET criteria, using the distal internal carotid diameter as the denominator. CONTRAST:  75 cc Isovue 370 intravenous COMPARISON:  None. FINDINGS: CT HEAD FINDINGS Brain: Mild patchy low-density in the cerebral white matter, greatest in the anterior limbs of the internal capsule. This changes are nonspecific, often from chronic microvascular ischemia. No hemorrhage, hydrocephalus, or evidence of gray matter infarct. Vascular: See below Skull: Negative Sinuses: Minimal fluid layering in the left maxillary sinus without associated fluid level. Orbits: Negative Review of the MIP images confirms the above findings CTA NECK FINDINGS Aortic arch: Normal diameter and appearance.  Four vessel branching. Right carotid system: Minimal plaque seen at the ICA bulb. No stenosis, ulceration, or dissection. Left carotid system: Minimal if any plaque at the ICA bulb. No stenosis, ulceration, or dissection. Vertebral arteries: No proximal subclavian stenosis on the right. The left vertebral artery arises from the arch. Right vertebral artery dominance. Vessels are smooth and widely patent. Skeleton: No acute or aggressive finding Other neck: No incidental mass or inflammation. No findings in the parotid or along the skullbase foramina.  Upper chest: Negative Review of the MIP images confirms the above findings CTA HEAD FINDINGS Anterior circulation: Vessels are smooth and widely patent. Negative for aneurysm or atheromatous changes. Posterior circulation: Fetal type right PCA. Right vertebral artery dominance. Vessels are smooth and widely patent. Negative for aneurysm. Venous sinuses: Widely patent Anatomic variants: Right fetal type PCA Delayed phase: No abnormal intracranial enhancement. No findings in the brainstem, CP angle cisterns, mastoids, or skullbase foramina to explain right facial weakness and numbness. Review of the MIP images confirms the above findings  IMPRESSION: 1. No acute finding or explanation for symptoms. No stenosis, occlusion, or dissection. Atheromatous changes are minimal. 2. Mild cerebral white matter disease, nonspecific but often from chronic microvascular ischemia if there are risk factors. Electronically Signed   By: Marnee Spring M.D.   On: 11/26/2016 09:10   Ct Angio Neck W And/or Wo Contrast  Result Date: 11/26/2016 CLINICAL DATA:  Headache for 4 days. Facial numbness on the right for 2 days. Right facial droop. EXAM: CT ANGIOGRAPHY HEAD AND NECK TECHNIQUE: Multidetector CT imaging of the head and neck was performed using the standard protocol during bolus administration of intravenous contrast. Multiplanar CT image reconstructions and MIPs were obtained to evaluate the vascular anatomy. Carotid stenosis measurements (when applicable) are obtained utilizing NASCET criteria, using the distal internal carotid diameter as the denominator. CONTRAST:  75 cc Isovue 370 intravenous COMPARISON:  None. FINDINGS: CT HEAD FINDINGS Brain: Mild patchy low-density in the cerebral white matter, greatest in the anterior limbs of the internal capsule. This changes are nonspecific, often from chronic microvascular ischemia. No hemorrhage, hydrocephalus, or evidence of gray matter infarct. Vascular: See below Skull: Negative Sinuses: Minimal fluid layering in the left maxillary sinus without associated fluid level. Orbits: Negative Review of the MIP images confirms the above findings CTA NECK FINDINGS Aortic arch: Normal diameter and appearance.  Four vessel branching. Right carotid system: Minimal plaque seen at the ICA bulb. No stenosis, ulceration, or dissection. Left carotid system: Minimal if any plaque at the ICA bulb. No stenosis, ulceration, or dissection. Vertebral arteries: No proximal subclavian stenosis on the right. The left vertebral artery arises from the arch. Right vertebral artery dominance. Vessels are smooth and widely patent. Skeleton:  No acute or aggressive finding Other neck: No incidental mass or inflammation. No findings in the parotid or along the skullbase foramina. Upper chest: Negative Review of the MIP images confirms the above findings CTA HEAD FINDINGS Anterior circulation: Vessels are smooth and widely patent. Negative for aneurysm or atheromatous changes. Posterior circulation: Fetal type right PCA. Right vertebral artery dominance. Vessels are smooth and widely patent. Negative for aneurysm. Venous sinuses: Widely patent Anatomic variants: Right fetal type PCA Delayed phase: No abnormal intracranial enhancement. No findings in the brainstem, CP angle cisterns, mastoids, or skullbase foramina to explain right facial weakness and numbness. Review of the MIP images confirms the above findings IMPRESSION: 1. No acute finding or explanation for symptoms. No stenosis, occlusion, or dissection. Atheromatous changes are minimal. 2. Mild cerebral white matter disease, nonspecific but often from chronic microvascular ischemia if there are risk factors. Electronically Signed   By: Marnee Spring M.D.   On: 11/26/2016 09:10    ____________________________________________   PROCEDURES  Procedure(s) performed: None  Procedures  Critical Care performed: No ____________________________________________   INITIAL IMPRESSION / ASSESSMENT AND PLAN / ED COURSE  Pertinent labs & imaging results that were available during my care of the patient were reviewed by me and considered  in my medical decision making (see chart for details).  56 y.o. male, otherwise healthy, presenting with right-sided headache associated with right facial droop and numbness, right upper extremity weakness and tingling. On my examination, the patient does have focal neurologic deficits. The forehead is spared, which makes Bell's palsy less likely. I do not see any evidence of viral syndrome. We'll get a CT and CT angiogram of the head and neck for further  evaluation. I am concerned about acute CVA, intracranial malignancy or tumor, and would also consider less prevalent illnesses such as multiple sclerosis. The patient will require admission for further evaluation and treatment.  ----------------------------------------- 9:59 AM on 11/26/2016 -----------------------------------------  The patient has some mild renal insufficiency but otherwise his laboratory studies are reassuring. The patient CT head, and CT angiograms of the head and neck, did not show any acute process. Given his multiple neurologic symptoms and findings on examination however, we'll plan admission. I have paged the neurologist on-call for further consultation. Consider additional imaging to rule out other pathology including multiple sclerosis.  ----------------------------------------- 10:10 AM on 11/26/2016 -----------------------------------------  I've spoken with Dr. Thad Ranger, the neurologist on-call, who recommends MRI and admission at this time.  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 He states his baseline creatinine is 1.4-1.6, so this is not abnormal for him. Given his IV contrast dye load, I will give him additional fluids. I have recommended that he tell all health care providers about this in the future.  ____________________________________________  FINAL CLINICAL IMPRESSION(S) / ED DIAGNOSES  Final diagnoses:  Renal insufficiency  Facial droop  Numbness and tingling of right face  Right arm weakness  Right arm numbness         NEW MEDICATIONS STARTED DURING THIS VISIT:  New Prescriptions   No medications on file      Rockne Menghini, MD 11/26/16 1001    Rockne Menghini, MD 11/26/16 1011

## 2016-11-26 NOTE — ED Notes (Signed)
Patient made aware of urine sample. States he is unable to void at this time.

## 2016-11-26 NOTE — ED Notes (Signed)
Patient transported to MRI 

## 2016-11-26 NOTE — ED Notes (Signed)
Patient states this morning he was having right arm weakness and numbness. Once patient arrived to ED the right arm weakness and numbness had resolved. Patient still c/o numbness to right side of face and neck.

## 2016-11-27 DIAGNOSIS — R29810 Facial weakness: Secondary | ICD-10-CM

## 2016-11-27 DIAGNOSIS — R29898 Other symptoms and signs involving the musculoskeletal system: Secondary | ICD-10-CM | POA: Diagnosis not present

## 2016-11-27 DIAGNOSIS — R202 Paresthesia of skin: Secondary | ICD-10-CM | POA: Diagnosis not present

## 2016-11-27 LAB — BASIC METABOLIC PANEL
Anion gap: 7 (ref 5–15)
BUN: 22 mg/dL — AB (ref 6–20)
CHLORIDE: 109 mmol/L (ref 101–111)
CO2: 23 mmol/L (ref 22–32)
Calcium: 8.9 mg/dL (ref 8.9–10.3)
Creatinine, Ser: 1.44 mg/dL — ABNORMAL HIGH (ref 0.61–1.24)
GFR calc Af Amer: >60 mL/min (ref >60)
GFR calc non Af Amer: 53 mL/min — ABNORMAL LOW (ref >60)
GLUCOSE: 108 mg/dL — AB (ref 65–99)
POTASSIUM: 4.3 mmol/L (ref 3.5–5.1)
Sodium: 139 mmol/L (ref 135–145)

## 2016-11-27 LAB — TSH: TSH: 1.96 u[IU]/mL (ref 0.350–4.500)

## 2016-11-27 LAB — LIPID PANEL
CHOLESTEROL: 198 mg/dL (ref 0–200)
HDL: 37 mg/dL — AB (ref >40)
LDL CALC: 113 mg/dL — AB (ref 0–99)
TRIGLYCERIDES: 240 mg/dL — AB (ref <150)
Total CHOL/HDL Ratio: 5.4 RATIO
VLDL: 48 mg/dL — ABNORMAL HIGH (ref 0–40)

## 2016-11-27 LAB — HIV ANTIBODY (ROUTINE TESTING W REFLEX): HIV SCREEN 4TH GENERATION: NONREACTIVE

## 2016-11-27 LAB — SEDIMENTATION RATE: Sed Rate: 9 mm/hr (ref 0–20)

## 2016-11-27 MED ORDER — ASPIRIN 81 MG PO TABS: 81.0000 mg | ORAL_TABLET | Freq: Every day | ORAL | 0 refills | Status: DC

## 2016-11-27 MED ORDER — PREDNISONE 50 MG PO TABS: 60.0000 mg | ORAL_TABLET | Freq: Every day | ORAL | Status: DC

## 2016-11-27 MED ORDER — VALACYCLOVIR HCL 1 G PO TABS: 1000.0000 mg | ORAL_TABLET | Freq: Three times a day (TID) | ORAL | 0 refills | Status: AC

## 2016-11-27 MED ORDER — ATORVASTATIN CALCIUM 40 MG PO TABS: 40.0000 mg | ORAL_TABLET | Freq: Every day | ORAL | 0 refills | Status: DC

## 2016-11-27 MED ORDER — PREDNISONE 10 MG PO TABS: ORAL_TABLET | ORAL | 0 refills | Status: DC

## 2016-11-27 NOTE — Consult Note (Signed)
Referring Physician: Allena Katz    Chief Complaint: Right facial droop and RUE weakness  HPI: Martin Hodge is an 56 y.o. male who reports that at the end of last week he began to have right sided headaches.  He then noted fullness on the right side of his face and some ringing in his right ear, particularly with playing instruments.  Saw ENT but antibiotics not started and patient advised OTC sinus regimen.  Later Monday noted right face feeling different.  On yesterday noted right sided facial droop on awakening and felt that right hand grip was weak.  Patient presented for evaluation at that time.  Today RUE at baseline.  Initial NIHSS of 1. Patient also describes tearing from the right eye and altered taste.   Date last known well: Date: 11/20/2016 Time last known well: Unable to determine tPA Given: No: Unable to determine LKW  Past Medical History:  Diagnosis Date  . GERD (gastroesophageal reflux disease)     Past Surgical History:  Procedure Laterality Date  . BACK SURGERY  1997   disk surgery in lower back   . CHOLECYSTECTOMY    . left kidney donatioin Left   . MEDIAL COLLATERAL LIGAMENT REPAIR, KNEE  2009    Family History  Problem Relation Age of Onset  . Stroke Mother   . Heart disease Father    Social History:  reports that he quit smoking about 33 years ago. He has never used smokeless tobacco. He reports that he drinks alcohol. He reports that he does not use drugs.  Allergies:  Allergies  Allergen Reactions  . Ibuprofen Other (See Comments)    Donated kidney in 2015.  Unable to take Ibuprofen.  . Sulfa Antibiotics     Hives, itching     Medications:  I have reviewed the patient's current medications. Prior to Admission:  Prescriptions Prior to Admission  Medication Sig Dispense Refill Last Dose  . Multiple Vitamin (MULTIVITAMIN PO) Take 1 tablet by mouth daily.    Past Month at Unknown time  . Saw Palmetto, Serenoa repens, (SAW PALMETTO PO) Take 1 tablet by  mouth daily.    Past Month at Unknown time   Scheduled: . aspirin  300 mg Rectal Daily   Or  . aspirin  325 mg Oral Daily  . atorvastatin  40 mg Oral q1800  . heparin  5,000 Units Subcutaneous Q8H  . [START ON 11/28/2016] predniSONE  60 mg Oral Q breakfast    ROS: History obtained from the patient  General ROS: negative for - chills, fatigue, fever, night sweats, weight gain or weight loss Psychological ROS: negative for - behavioral disorder, hallucinations, memory difficulties, mood swings or suicidal ideation Ophthalmic ROS: negative for - blurry vision, double vision, eye pain or loss of vision ENT ROS: as noted in HPI Allergy and Immunology ROS: negative for - hives or itchy/watery eyes Hematological and Lymphatic ROS: negative for - bleeding problems, bruising or swollen lymph nodes Endocrine ROS: negative for - galactorrhea, hair pattern changes, polydipsia/polyuria or temperature intolerance Respiratory ROS: negative for - cough, hemoptysis, shortness of breath or wheezing Cardiovascular ROS: negative for - chest pain, dyspnea on exertion, edema or irregular heartbeat Gastrointestinal ROS: negative for - abdominal pain, diarrhea, hematemesis, nausea/vomiting or stool incontinence Genito-Urinary ROS: negative for - dysuria, hematuria, incontinence or urinary frequency/urgency Musculoskeletal ROS: negative for - joint swelling or muscular weakness Neurological ROS: as noted in HPI Dermatological ROS: negative for rash and skin lesion changes  Physical  Examination: Blood pressure (!) 141/83, pulse 70, temperature 98.5 F (36.9 C), temperature source Oral, resp. rate 20, height 5\' 10"  (1.778 m), weight 91.1 kg (200 lb 12.8 oz), SpO2 99 %.  HEENT-  Normocephalic, no lesions, without obvious abnormality.  Normal external eye and conjunctiva.  Normal TM's bilaterally.  Normal auditory canals and external ears. Normal external nose, mucus membranes and septum.  Normal  pharynx. Cardiovascular- S1, S2 normal, pulses palpable throughout   Lungs- chest clear, no wheezing, rales, normal symmetric air entry Abdomen- soft, non-tender; bowel sounds normal; no masses,  no organomegaly Extremities- no edema Lymph-no adenopathy palpable Musculoskeletal-no joint tenderness, deformity or swelling Skin-warm and dry, no hyperpigmentation, vitiligo, or suspicious lesions  Neurological Examination   Mental Status: Alert, oriented, thought content appropriate.  Speech fluent without evidence of aphasia.  Able to follow 3 step commands without difficulty. Cranial Nerves: II: Discs flat bilaterally; Visual fields grossly normal, pupils equal, round, reactive to light and accommodation III,IV, VI: ptosis not present, extra-ocular motions intact bilaterally V,VII: right facial droop with decreased furrowing of the right forehead, facial light touch sensation normal bilaterally VIII: hearing normal bilaterally IX,X: gag reflex present XI: bilateral shoulder shrug XII: midline tongue extension Motor: Right : Upper extremity   5/5    Left:     Upper extremity   5/5  Lower extremity   5/5     Lower extremity   5/5 Tone and bulk:normal tone throughout; no atrophy noted Sensory: Pinprick and light touch intact throughout, bilaterally Deep Tendon Reflexes: 2+ and symmetric throughout Plantars: Right: downgoing   Left: downgoing Cerebellar: Normal finger-to-nose and normal heel-to-shin testingbilaterally Gait: normal gait and station    Laboratory Studies:  Basic Metabolic Panel:  Recent Labs Lab 11/26/16 0747 11/27/16 0456  NA 140 139  K 4.5 4.3  CL 108 109  CO2 24 23  GLUCOSE 112* 108*  BUN 26* 22*  CREATININE 1.60* 1.44*  CALCIUM 9.4 8.9    Liver Function Tests: No results for input(s): AST, ALT, ALKPHOS, BILITOT, PROT, ALBUMIN in the last 168 hours. No results for input(s): LIPASE, AMYLASE in the last 168 hours. No results for input(s): AMMONIA in the  last 168 hours.  CBC:  Recent Labs Lab 11/26/16 0747  WBC 4.9  HGB 14.2  HCT 41.9  MCV 89.8  PLT 206    Cardiac Enzymes: No results for input(s): CKTOTAL, CKMB, CKMBINDEX, TROPONINI in the last 168 hours.  BNP: Invalid input(s): POCBNP  CBG: No results for input(s): GLUCAP in the last 168 hours.  Microbiology: Results for orders placed or performed during the hospital encounter of 02/07/11  Surgical pcr screen     Status: None   Collection Time: 02/07/11  5:54 AM  Result Value Ref Range Status   MRSA, PCR NEGATIVE NEGATIVE Final   Staphylococcus aureus NEGATIVE NEGATIVE Final    Comment:        The Xpert SA Assay (FDA approved for NASAL specimens only), is one component of a comprehensive surveillance program.  It is not intended to diagnose infection nor to guide or monitor treatment.    Coagulation Studies:  Recent Labs  11/26/16 0747  LABPROT 12.6  INR 0.94    Urinalysis:  Recent Labs Lab 11/26/16 0747  COLORURINE STRAW*  LABSPEC 1.028  PHURINE 6.0  GLUCOSEU NEGATIVE  HGBUR NEGATIVE  BILIRUBINUR NEGATIVE  KETONESUR NEGATIVE  PROTEINUR NEGATIVE  NITRITE NEGATIVE  LEUKOCYTESUR NEGATIVE    Lipid Panel:    Component Value Date/Time  CHOL 198 11/27/2016 0456   TRIG 240 (H) 11/27/2016 0456   HDL 37 (L) 11/27/2016 0456   CHOLHDL 5.4 11/27/2016 0456   VLDL 48 (H) 11/27/2016 0456   LDLCALC 113 (H) 11/27/2016 0456    HgbA1C: No results found for: HGBA1C  Urine Drug Screen:     Component Value Date/Time   LABOPIA NONE DETECTED 11/26/2016 0747   COCAINSCRNUR NONE DETECTED 11/26/2016 0747   LABBENZ NONE DETECTED 11/26/2016 0747   AMPHETMU NONE DETECTED 11/26/2016 0747   THCU NONE DETECTED 11/26/2016 0747   LABBARB NONE DETECTED 11/26/2016 0747    Alcohol Level:  Recent Labs Lab 11/26/16 0747  ETH <5    Other results: EKG: sinus rhythm at 80 bpm.  Imaging: Ct Angio Head W Or Wo Contrast  Result Date: 11/26/2016 CLINICAL DATA:   Headache for 4 days. Facial numbness on the right for 2 days. Right facial droop. EXAM: CT ANGIOGRAPHY HEAD AND NECK TECHNIQUE: Multidetector CT imaging of the head and neck was performed using the standard protocol during bolus administration of intravenous contrast. Multiplanar CT image reconstructions and MIPs were obtained to evaluate the vascular anatomy. Carotid stenosis measurements (when applicable) are obtained utilizing NASCET criteria, using the distal internal carotid diameter as the denominator. CONTRAST:  75 cc Isovue 370 intravenous COMPARISON:  None. FINDINGS: CT HEAD FINDINGS Brain: Mild patchy low-density in the cerebral white matter, greatest in the anterior limbs of the internal capsule. This changes are nonspecific, often from chronic microvascular ischemia. No hemorrhage, hydrocephalus, or evidence of gray matter infarct. Vascular: See below Skull: Negative Sinuses: Minimal fluid layering in the left maxillary sinus without associated fluid level. Orbits: Negative Review of the MIP images confirms the above findings CTA NECK FINDINGS Aortic arch: Normal diameter and appearance.  Four vessel branching. Right carotid system: Minimal plaque seen at the ICA bulb. No stenosis, ulceration, or dissection. Left carotid system: Minimal if any plaque at the ICA bulb. No stenosis, ulceration, or dissection. Vertebral arteries: No proximal subclavian stenosis on the right. The left vertebral artery arises from the arch. Right vertebral artery dominance. Vessels are smooth and widely patent. Skeleton: No acute or aggressive finding Other neck: No incidental mass or inflammation. No findings in the parotid or along the skullbase foramina. Upper chest: Negative Review of the MIP images confirms the above findings CTA HEAD FINDINGS Anterior circulation: Vessels are smooth and widely patent. Negative for aneurysm or atheromatous changes. Posterior circulation: Fetal type right PCA. Right vertebral artery  dominance. Vessels are smooth and widely patent. Negative for aneurysm. Venous sinuses: Widely patent Anatomic variants: Right fetal type PCA Delayed phase: No abnormal intracranial enhancement. No findings in the brainstem, CP angle cisterns, mastoids, or skullbase foramina to explain right facial weakness and numbness. Review of the MIP images confirms the above findings IMPRESSION: 1. No acute finding or explanation for symptoms. No stenosis, occlusion, or dissection. Atheromatous changes are minimal. 2. Mild cerebral white matter disease, nonspecific but often from chronic microvascular ischemia if there are risk factors. Electronically Signed   By: Marnee Spring M.D.   On: 11/26/2016 09:10   Ct Angio Neck W And/or Wo Contrast  Result Date: 11/26/2016 CLINICAL DATA:  Headache for 4 days. Facial numbness on the right for 2 days. Right facial droop. EXAM: CT ANGIOGRAPHY HEAD AND NECK TECHNIQUE: Multidetector CT imaging of the head and neck was performed using the standard protocol during bolus administration of intravenous contrast. Multiplanar CT image reconstructions and MIPs were obtained to evaluate  the vascular anatomy. Carotid stenosis measurements (when applicable) are obtained utilizing NASCET criteria, using the distal internal carotid diameter as the denominator. CONTRAST:  75 cc Isovue 370 intravenous COMPARISON:  None. FINDINGS: CT HEAD FINDINGS Brain: Mild patchy low-density in the cerebral white matter, greatest in the anterior limbs of the internal capsule. This changes are nonspecific, often from chronic microvascular ischemia. No hemorrhage, hydrocephalus, or evidence of gray matter infarct. Vascular: See below Skull: Negative Sinuses: Minimal fluid layering in the left maxillary sinus without associated fluid level. Orbits: Negative Review of the MIP images confirms the above findings CTA NECK FINDINGS Aortic arch: Normal diameter and appearance.  Four vessel branching. Right carotid  system: Minimal plaque seen at the ICA bulb. No stenosis, ulceration, or dissection. Left carotid system: Minimal if any plaque at the ICA bulb. No stenosis, ulceration, or dissection. Vertebral arteries: No proximal subclavian stenosis on the right. The left vertebral artery arises from the arch. Right vertebral artery dominance. Vessels are smooth and widely patent. Skeleton: No acute or aggressive finding Other neck: No incidental mass or inflammation. No findings in the parotid or along the skullbase foramina. Upper chest: Negative Review of the MIP images confirms the above findings CTA HEAD FINDINGS Anterior circulation: Vessels are smooth and widely patent. Negative for aneurysm or atheromatous changes. Posterior circulation: Fetal type right PCA. Right vertebral artery dominance. Vessels are smooth and widely patent. Negative for aneurysm. Venous sinuses: Widely patent Anatomic variants: Right fetal type PCA Delayed phase: No abnormal intracranial enhancement. No findings in the brainstem, CP angle cisterns, mastoids, or skullbase foramina to explain right facial weakness and numbness. Review of the MIP images confirms the above findings IMPRESSION: 1. No acute finding or explanation for symptoms. No stenosis, occlusion, or dissection. Atheromatous changes are minimal. 2. Mild cerebral white matter disease, nonspecific but often from chronic microvascular ischemia if there are risk factors. Electronically Signed   By: Marnee Spring M.D.   On: 11/26/2016 09:10   Mr Brain Wo Contrast  Result Date: 11/26/2016 CLINICAL DATA:  Right facial droop and numbness. Right upper extremity weakness and tingling. EXAM: MRI HEAD WITHOUT CONTRAST TECHNIQUE: Multiplanar, multiecho pulse sequences of the brain and surrounding structures were obtained without intravenous contrast. COMPARISON:  Head CTA 11/26/2016 FINDINGS: Brain: There is no evidence of acute infarct, intracranial hemorrhage, mass, midline shift, or  extra-axial fluid collection. The ventricles and sulci are normal. Patchy T2 hyperintensities in the subcortical and deep cerebral white matter bilaterally are mildly to moderately advanced for age. Vascular: Major intracranial vascular flow voids are preserved. Skull and upper cervical spine: Unremarkable bone marrow signal. Sinuses/Orbits: Unremarkable orbits. Trace left maxillary sinus fluid. Clear mastoid air cells. Other: None. IMPRESSION: 1. No acute intracranial abnormality. 2. Mildly to moderately age advanced cerebral white matter T2 signal changes, nonspecific though most often seen with chronic small vessel ischemia. Electronically Signed   By: Sebastian Ache M.D.   On: 11/26/2016 11:49    Assessment: 56 y.o. male presenting with complaints of right facial droop and RUE weakness.  No weakness noted on today's evaluation and facial droop looks and sounds clinically like it is peripheral.  MRI of the brain reviewed and shows no acute changes.  CTA unremarkable.  Although exam today very consistent with a Bell's palsy can not ignore upper extremity complaints and therefore can not rule out an MR negative infarct. A1c pending.  LDL 113.  Echo pending.    Stroke Risk Factors - hyperlipidemia  Plan: 1. PT  consult, OT consult, Speech consult 2. Prophylactic therapy-Antiplatelet med: Aspirin - dose 81mg  daily 3. Telemetry monitoring 4. Frequent neuro checks 5. Statin for lipid management with target LDL<70. 6. Treatment for Bell's palsy with steroids and antiviral 7. Patient to follow up with neurology on an outpatient basis.  Requests to be seen at [facility].     Case discussed with Dr. Allena Katz.    Thana Farr, MD Neurology [phone] 11/27/2016, 1:56 PM

## 2016-11-27 NOTE — Discharge Summary (Signed)
[facility] [place] [facility]   PATIENT NAME: Martin Hodge    MR#:  [id number]  DATE OF BIRTH:  December 19, 1960  DATE OF ADMISSION:  11/26/2016 ADMITTING PHYSICIAN: Shaune Pollack, MD  DATE OF DISCHARGE: 11/27/16  PRIMARY CARE PHYSICIAN: Jerl Mina, MD    ADMISSION DIAGNOSIS:  Facial droop [R29.810] Renal insufficiency [N28.9] Right arm numbness [R20.2] Right arm weakness [R29.898] Numbness and tingling of right face [R20.0, R20.2]  DISCHARGE DIAGNOSIS:  Right facial palsy TIA Hyperlipidemia  SECONDARY DIAGNOSIS:   Past Medical History:  Diagnosis Date  . GERD (gastroesophageal reflux disease)     HOSPITAL COURSE:   Martin Hodge is a 56 y.o. male , RH, otherwise healthy, presenting with right facial droop and numbness, right upper extremity weakness and tingling. The patient reports that 4 days ago he developed a right-sided headache that radiated into the neck with a sensation of ear fullness. 3 days ago, he then developed a right-sided facial droop associated with numbness of the face.  1. Right facial palsy -Patient seen by neurology -MRI brain negative, CT angiogram of the neck negative, echo done results pending -Prednisone 60 mg daily for 5 days and then taper by 10 mg daily and stop -Valacyclovir 1000 milligrams 3 times a day for 7 days  2. Right upper extremity weakness possible TIA -MRI brain negative -Statins started, aspirin 81 mg daily  Patient will follow up with [facility] neurology as outpatient. Wife will make appointment.  Patient otherwise is stable. Discharged to home. He is agreeable. CONSULTS OBTAINED:  Treatment Team:  Thana Farr, MD  DRUG ALLERGIES:   Allergies  Allergen Reactions  . Ibuprofen Other (See Comments)    Donated kidney in 2015.  Unable to take Ibuprofen.  . Sulfa Antibiotics     Hives, itching     DISCHARGE MEDICATIONS:   Current Discharge Medication List    START taking these  medications   Details  aspirin 81 MG tablet Take 1 tablet (81 mg total) by mouth daily. Qty: 30 tablet, Refills: 0    atorvastatin (LIPITOR) 40 MG tablet Take 1 tablet (40 mg total) by mouth daily at 6 PM. Qty: 30 tablet, Refills: 0    predniSONE (DELTASONE) 10 MG tablet Take 60 mg daily for 5 days and thereafter taper by 10 mg daily from the 6th day Qty: 45 tablet, Refills: 0    valACYclovir (VALTREX) 1000 MG tablet Take 1 tablet (1,000 mg total) by mouth 3 (three) times daily. Qty: 21 tablet, Refills: 0      CONTINUE these medications which have NOT CHANGED   Details  Multiple Vitamin (MULTIVITAMIN PO) Take 1 tablet by mouth daily.       STOP taking these medications     Saw Palmetto, Serenoa repens, (SAW PALMETTO PO)         If you experience worsening of your admission symptoms, develop shortness of breath, life threatening emergency, suicidal or homicidal thoughts you must seek medical attention immediately by calling 911 or calling your MD immediately  if symptoms less severe.  You Must read complete instructions/literature along with all the possible adverse reactions/side effects for all the Medicines you take and that have been prescribed to you. Take any new Medicines after you have completely understood and accept all the possible adverse reactions/side effects.   Please note  You were cared for by a hospitalist during your hospital stay. If you have any questions about your discharge medications or the care  you received while you were in the hospital after you are discharged, you can call the unit and asked to speak with the hospitalist on call if the hospitalist that took care of you is not available. Once you are discharged, your primary care physician will handle any further medical issues. Please note that NO REFILLS for any discharge medications will be authorized once you are discharged, as it is imperative that you return to your primary care physician (or  establish a relationship with a primary care physician if you do not have one) for your aftercare needs so that they can reassess your need for medications and monitor your lab values. Today   SUBJECTIVE  Denies any new complaints. Came in after having right ear heaviness and headache with neck pain and started having symptoms of numbness over the right face   VITAL SIGNS:  Blood pressure (!) 141/83, pulse 70, temperature 98.5 F (36.9 C), temperature source Oral, resp. rate 20, height 5\' 10"  (1.778 m), weight 91.1 kg (200 lb 12.8 oz), SpO2 99 %.  I/O:   Intake/Output Summary (Last 24 hours) at 11/27/16 1311 Last data filed at 11/27/16 1001  Gross per 24 hour  Intake          2267.67 ml  Output                0 ml  Net          2267.67 ml    PHYSICAL EXAMINATION:  GENERAL:  56 y.o.-year-old patient lying in the bed with no acute distress.  EYES: Pupils equal, round, reactive to light and accommodation. No scleral icterus. Extraocular muscles intact.  HEENT: Head atraumatic, normocephalic. Oropharynx and nasopharynx clear.  NECK:  Supple, no jugular venous distention. No thyroid enlargement, no tenderness.  LUNGS: Normal breath sounds bilaterally, no wheezing, rales,rhonchi or crepitation. No use of accessory muscles of respiration.  CARDIOVASCULAR: S1, S2 normal. No murmurs, rubs, or gallops.  ABDOMEN: Soft, non-tender, non-distended. Bowel sounds present. No organomegaly or mass.  EXTREMITIES: No pedal edema, cyanosis, or clubbing.  NEUROLOGIC: Muscle strength 5/5 in all extremities. Sensation intact. Gait not checked. Right-sided Bell's palsy PSYCHIATRIC: The patient is alert and oriented x 3.  SKIN: No obvious rash, lesion, or ulcer.   DATA REVIEW:   CBC   Recent Labs Lab 11/26/16 0747  WBC 4.9  HGB 14.2  HCT 41.9  PLT 206    Chemistries   Recent Labs Lab 11/27/16 0456  NA 139  K 4.3  CL 109  CO2 23  GLUCOSE 108*  BUN 22*  CREATININE 1.44*  CALCIUM 8.9     Microbiology Results   No results found for this or any previous visit (from the past 240 hour(s)).  RADIOLOGY:  Ct Angio Head W Or Wo Contrast  Result Date: 11/26/2016 CLINICAL DATA:  Headache for 4 days. Facial numbness on the right for 2 days. Right facial droop. EXAM: CT ANGIOGRAPHY HEAD AND NECK TECHNIQUE: Multidetector CT imaging of the head and neck was performed using the standard protocol during bolus administration of intravenous contrast. Multiplanar CT image reconstructions and MIPs were obtained to evaluate the vascular anatomy. Carotid stenosis measurements (when applicable) are obtained utilizing NASCET criteria, using the distal internal carotid diameter as the denominator. CONTRAST:  75 cc Isovue 370 intravenous COMPARISON:  None. FINDINGS: CT HEAD FINDINGS Brain: Mild patchy low-density in the cerebral white matter, greatest in the anterior limbs of the internal capsule. This changes are nonspecific, often from chronic  microvascular ischemia. No hemorrhage, hydrocephalus, or evidence of gray matter infarct. Vascular: See below Skull: Negative Sinuses: Minimal fluid layering in the left maxillary sinus without associated fluid level. Orbits: Negative Review of the MIP images confirms the above findings CTA NECK FINDINGS Aortic arch: Normal diameter and appearance.  Four vessel branching. Right carotid system: Minimal plaque seen at the ICA bulb. No stenosis, ulceration, or dissection. Left carotid system: Minimal if any plaque at the ICA bulb. No stenosis, ulceration, or dissection. Vertebral arteries: No proximal subclavian stenosis on the right. The left vertebral artery arises from the arch. Right vertebral artery dominance. Vessels are smooth and widely patent. Skeleton: No acute or aggressive finding Other neck: No incidental mass or inflammation. No findings in the parotid or along the skullbase foramina. Upper chest: Negative Review of the MIP images confirms the above findings  CTA HEAD FINDINGS Anterior circulation: Vessels are smooth and widely patent. Negative for aneurysm or atheromatous changes. Posterior circulation: Fetal type right PCA. Right vertebral artery dominance. Vessels are smooth and widely patent. Negative for aneurysm. Venous sinuses: Widely patent Anatomic variants: Right fetal type PCA Delayed phase: No abnormal intracranial enhancement. No findings in the brainstem, CP angle cisterns, mastoids, or skullbase foramina to explain right facial weakness and numbness. Review of the MIP images confirms the above findings IMPRESSION: 1. No acute finding or explanation for symptoms. No stenosis, occlusion, or dissection. Atheromatous changes are minimal. 2. Mild cerebral white matter disease, nonspecific but often from chronic microvascular ischemia if there are risk factors. Electronically Signed   By: Marnee Spring M.D.   On: 11/26/2016 09:10   Ct Angio Neck W And/or Wo Contrast  Result Date: 11/26/2016 CLINICAL DATA:  Headache for 4 days. Facial numbness on the right for 2 days. Right facial droop. EXAM: CT ANGIOGRAPHY HEAD AND NECK TECHNIQUE: Multidetector CT imaging of the head and neck was performed using the standard protocol during bolus administration of intravenous contrast. Multiplanar CT image reconstructions and MIPs were obtained to evaluate the vascular anatomy. Carotid stenosis measurements (when applicable) are obtained utilizing NASCET criteria, using the distal internal carotid diameter as the denominator. CONTRAST:  75 cc Isovue 370 intravenous COMPARISON:  None. FINDINGS: CT HEAD FINDINGS Brain: Mild patchy low-density in the cerebral white matter, greatest in the anterior limbs of the internal capsule. This changes are nonspecific, often from chronic microvascular ischemia. No hemorrhage, hydrocephalus, or evidence of gray matter infarct. Vascular: See below Skull: Negative Sinuses: Minimal fluid layering in the left maxillary sinus without  associated fluid level. Orbits: Negative Review of the MIP images confirms the above findings CTA NECK FINDINGS Aortic arch: Normal diameter and appearance.  Four vessel branching. Right carotid system: Minimal plaque seen at the ICA bulb. No stenosis, ulceration, or dissection. Left carotid system: Minimal if any plaque at the ICA bulb. No stenosis, ulceration, or dissection. Vertebral arteries: No proximal subclavian stenosis on the right. The left vertebral artery arises from the arch. Right vertebral artery dominance. Vessels are smooth and widely patent. Skeleton: No acute or aggressive finding Other neck: No incidental mass or inflammation. No findings in the parotid or along the skullbase foramina. Upper chest: Negative Review of the MIP images confirms the above findings CTA HEAD FINDINGS Anterior circulation: Vessels are smooth and widely patent. Negative for aneurysm or atheromatous changes. Posterior circulation: Fetal type right PCA. Right vertebral artery dominance. Vessels are smooth and widely patent. Negative for aneurysm. Venous sinuses: Widely patent Anatomic variants: Right fetal type PCA Delayed  phase: No abnormal intracranial enhancement. No findings in the brainstem, CP angle cisterns, mastoids, or skullbase foramina to explain right facial weakness and numbness. Review of the MIP images confirms the above findings IMPRESSION: 1. No acute finding or explanation for symptoms. No stenosis, occlusion, or dissection. Atheromatous changes are minimal. 2. Mild cerebral white matter disease, nonspecific but often from chronic microvascular ischemia if there are risk factors. Electronically Signed   By: Marnee SpringJonathon  Watts M.D.   On: 11/26/2016 09:10   Mr Brain Wo Contrast  Result Date: 11/26/2016 CLINICAL DATA:  Right facial droop and numbness. Right upper extremity weakness and tingling. EXAM: MRI HEAD WITHOUT CONTRAST TECHNIQUE: Multiplanar, multiecho pulse sequences of the brain and surrounding  structures were obtained without intravenous contrast. COMPARISON:  Head CTA 11/26/2016 FINDINGS: Brain: There is no evidence of acute infarct, intracranial hemorrhage, mass, midline shift, or extra-axial fluid collection. The ventricles and sulci are normal. Patchy T2 hyperintensities in the subcortical and deep cerebral white matter bilaterally are mildly to moderately advanced for age. Vascular: Major intracranial vascular flow voids are preserved. Skull and upper cervical spine: Unremarkable bone marrow signal. Sinuses/Orbits: Unremarkable orbits. Trace left maxillary sinus fluid. Clear mastoid air cells. Other: None. IMPRESSION: 1. No acute intracranial abnormality. 2. Mildly to moderately age advanced cerebral white matter T2 signal changes, nonspecific though most often seen with chronic small vessel ischemia. Electronically Signed   By: Sebastian AcheAllen  Grady M.D.   On: 11/26/2016 11:49     Management plans discussed with the patient, family and they are in agreement.  CODE STATUS:     Code Status Orders        Start     Ordered   11/26/16 1214  Full code  Continuous     11/26/16 1213    Code Status History    Date Active Date Inactive Code Status Order ID Comments User Context   This patient has a current code status but no historical code status.      TOTAL TIME TAKING CARE OF THIS PATIENT:40 minutes.    Davon Folta M.D on 11/27/2016 at 1:11 PM  Between 7am to 6pm - Pager - [phone] After 6pm go to [email or web address] - Social research officer, [email or web address] [email or web address]  [facility]  Office  [phone]  CC: Primary care physician; Jerl MinaHedrick, James, MD

## 2016-11-27 NOTE — Progress Notes (Signed)
Discharge instructions along with home medications and follow up gone over with patient. He verbalized that he understood instructions. No prescriptions given to patient. IV and tele removed. Pt being discharged home on room air, no distress noted. Shailee Foots S Fenton, RN 

## 2016-11-28 LAB — HEMOGLOBIN A1C
HEMOGLOBIN A1C: 5.7 % — AB (ref 4.8–5.6)
MEAN PLASMA GLUCOSE: 117 mg/dL

## 2017-02-09 ENCOUNTER — Encounter (INDEPENDENT_AMBULATORY_CARE_PROVIDER_SITE_OTHER): Payer: Self-pay

## 2017-02-09 ENCOUNTER — Ambulatory Visit (INDEPENDENT_AMBULATORY_CARE_PROVIDER_SITE_OTHER): Payer: 59 | Admitting: Neurology

## 2017-02-09 ENCOUNTER — Encounter: Payer: Self-pay | Admitting: Neurology

## 2017-02-09 VITALS — BP 126/86 | HR 70 | Ht 70.0 in | Wt 198.8 lb

## 2017-02-09 DIAGNOSIS — G43709 Chronic migraine without aura, not intractable, without status migrainosus: Secondary | ICD-10-CM

## 2017-02-09 DIAGNOSIS — I679 Cerebrovascular disease, unspecified: Secondary | ICD-10-CM | POA: Insufficient documentation

## 2017-02-09 DIAGNOSIS — IMO0002 Reserved for concepts with insufficient information to code with codable children: Secondary | ICD-10-CM

## 2017-02-09 MED ORDER — SUMATRIPTAN SUCCINATE 50 MG PO TABS: 50.0000 mg | ORAL_TABLET | ORAL | 11 refills | Status: DC | PRN

## 2017-02-09 MED ORDER — PROPRANOLOL HCL 40 MG PO TABS: 40.0000 mg | ORAL_TABLET | Freq: Two times a day (BID) | ORAL | 11 refills | Status: DC

## 2017-02-09 MED ORDER — ONDANSETRON 4 MG PO TBDP: 4.0000 mg | ORAL_TABLET | Freq: Three times a day (TID) | ORAL | 11 refills | Status: DC | PRN

## 2017-02-09 NOTE — Patient Instructions (Signed)
Magnesium oxide 400 mg twice a day  Riboflavin= vitamin B2 100 mg twice a day  As migraine prevention  When you do get migraine,  Take Imitrex as needed

## 2017-02-09 NOTE — Progress Notes (Signed)
PATIENT: Martin Hodge DOB: 12/10/1960  Chief Complaint  Patient presents with  . Bell's Palsy    He is here with his wife, Martin Hodge.  Reports episode of right-sided facial weakness that was diagnosed as Bell's Palsy in ED.  He still has some tingling on the right side of his nose.  He feels his right eyebrow does is asymmetrical.  He was concerned about the event being a TIA due to weakness in right hand.  . Headache    He estimates one headache per week that he uses Sudafed to treat.  He limits NSAID use to having one kidney.  Marland Kitchen PCP    Martin Mina, MD     HISTORICAL  Martin Hodge is a 56 year old male, seen in refer by his primary care doctor  Martin Hodge, for evaluation of headaches, and right Bell's palsy, initial evaluation was on February 09 2017.  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  He presented to the emergency room on November 26 2016 with right facial upper and lower face droopiness, numbness, and also headache starting from right neck, right ear fullness, hypersensitivity to sound, decreased taste on the right side, dry eye dry mouth.  He was diagnosed with right Bell's palsy, at its worst, he has difficulty closing his right eye, droopiness at his right mouth corner, he was given a prescription of prednisone tapering, valacyclovir 1000 mg 3 times a day for week, he had regained quick recovery, almost 100% recovery by 2 weeks,  He had extensive evaluation of the emergency room because reported mild right hand weakness that lasts for 1 hour,   I have personally reviewed MR of the brain, not supratentorium small vessel disease  CT angiogram of neck  and neck showed no large vessel disease, echocardiogram was done, reported no significant abnormality, but I do not have formal report,  He is now taking daily aspirin, 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   REVIEW OF SYSTEMS: Full 14 system review of systems performed and notable only for confusion, headaches, tremor, insomnia, sleepiness, depression, anxiety, not enough sleep, decreased energy, change in appetite, hallucinations, joint pain, achy muscles  ALLERGIES: Allergies  Allergen Reactions  . Ibuprofen Other (See Comments)    Donated kidney in 2015.  Unable to take Ibuprofen.  . Sulfa Antibiotics     Hives, itching     HOME MEDICATIONS: Current Outpatient Prescriptions  Medication Sig Dispense Refill  . aspirin 81 MG tablet Take 1 tablet (81 mg total) by mouth daily. 30 tablet 0  . Multiple Vitamin (MULTIVITAMIN PO) Take 1 tablet by mouth daily.      No current facility-administered medications for this visit.     PAST MEDICAL HISTORY: Past Medical History:  Diagnosis Date  . Bell's palsy   . GERD (gastroesophageal reflux disease)   . Headache  PAST SURGICAL HISTORY: Past Surgical History:  Procedure Laterality Date  . BACK SURGERY  1997   disk surgery in lower back   . CHOLECYSTECTOMY    . left kidney donatioin Left   . MEDIAL COLLATERAL LIGAMENT REPAIR, KNEE  2009    FAMILY HISTORY: Family History  Problem Relation Age of Onset  . Stroke Mother   . Heart disease Father   . Heart attack Father   . COPD Father     SOCIAL HISTORY:  Social History   Social History  . Marital status: Married    Spouse name: N/A  . Number  of children: 1  . Years of education: HS   Occupational History  . Not on file.   Social History Main Topics  . Smoking status: Former Smoker    Quit date: 1984  . Smokeless tobacco: Never Used  . Alcohol use Yes     Comment: social drinker - maybe 12 per month  . Drug use: No  . Sexual activity: Not on file   Other Topics Concern  . Not on file   Social History Narrative   Lives at home with his wife.   Right-handed.   2-3 cups of caffeine per day.     PHYSICAL EXAM   Vitals:   02/09/17 0925  BP: 126/86  Pulse: 70  Weight: 198 lb 12 oz (90.2 kg)  Height:  (1.778 m)    Not recorded      Body mass index is 28.52 kg/m.  PHYSICAL EXAMNIATION:  Gen: NAD, conversant, well nourised, obese, well groomed                     Cardiovascular: Regular rate rhythm, no peripheral edema, warm, nontender. Eyes: Conjunctivae clear without exudates or hemorrhage Neck: Supple, no carotid bruits. Pulmonary: Clear to auscultation bilaterally   NEUROLOGICAL EXAM:  MENTAL STATUS: Speech:    Speech is normal; fluent and spontaneous with normal comprehension.  Cognition:     Orientation to time, place and person     Normal recent and remote memory     Normal Attention span and concentration     Normal Language, naming, repeating,spontaneous speech     Fund of knowledge   CRANIAL NERVES: CN II: Visual fields are full to confrontation. Fundoscopic exam is normal with sharp discs and no vascular changes. Pupils are round equal and briskly reactive to light. CN III, IV, VI: extraocular movement are normal. No ptosis. CN V: Facial sensation is intact to pinprick in all 3 divisions bilaterally. Corneal responses are intact.  CN VII: Face is symmetric with normal eye closure and smile. CN VIII: Hearing is normal to rubbing fingers CN IX, X: Palate elevates symmetrically. Phonation is normal. CN XI: Head turning and shoulder shrug are intact CN XII: Tongue is midline with  normal movements and no atrophy.  MOTOR: There is no pronator drift of out-stretched arms. Muscle bulk and tone are normal. Muscle strength is normal.  REFLEXES: Reflexes are 2+ and symmetric at the biceps, triceps, knees, and ankles. Plantar responses are flexor.  SENSORY: Intact to light touch, pinprick, positional sensation and vibratory sensation are intact in fingers and toes.  COORDINATION: Rapid alternating movements and fine finger movements are intact. There is no dysmetria on finger-to-nose and heel-knee-shin.    GAIT/STANCE: Posture is normal. Gait is steady with normal steps, base, arm swing, and turning. Heel and toe walking are normal. Tandem gait is normal.  Romberg is  absent.   DIAGNOSTIC DATA (LABS, IMAGING, TESTING) - I reviewed patient records, labs, notes, testing and imaging myself where available.   ASSESSMENT AND PLAN  Martin Hodge is a 56 y.o. male   Chronic migraine  Start preventive medications Inderal 40 mg twice a day  Imitrex 50 mg, Zofran as needed  History of right Bell's palsy on November 26 2016  He regained complete recovery  Cerebral small vessel disease  He had a history of mild abnormal A1c 5.7, chronic kidney disease, GFR of 54, history of left kidney donor  Continue aspirin 81 mg daily  I encouraged him to increase water intake   Martin Hodge, M.D. Ph.D.  [facility] [address], [address] [place], [place] [address] Ph: [phone] Fax: [phone]  CC: Martin Mina, MD

## 2017-06-03 DIAGNOSIS — Z905 Acquired absence of kidney: Secondary | ICD-10-CM | POA: Insufficient documentation

## 2017-08-04 ENCOUNTER — Other Ambulatory Visit: Payer: Self-pay | Admitting: Family Medicine

## 2017-08-04 DIAGNOSIS — R109 Unspecified abdominal pain: Secondary | ICD-10-CM

## 2017-08-10 ENCOUNTER — Ambulatory Visit: Payer: 59 | Admitting: Neurology

## 2018-01-22 ENCOUNTER — Other Ambulatory Visit: Payer: Self-pay | Admitting: Neurology

## 2018-04-03 ENCOUNTER — Other Ambulatory Visit: Payer: Self-pay | Admitting: Neurology

## 2018-08-31 ENCOUNTER — Telehealth: Payer: Self-pay | Admitting: Neurology

## 2018-08-31 ENCOUNTER — Other Ambulatory Visit: Payer: Self-pay | Admitting: Neurology

## 2018-08-31 NOTE — Telephone Encounter (Signed)
I have spoken with this patient and explained the process of having a virtual visit.  He is scheduled for 09/02/2018 at 11:30am.

## 2018-08-31 NOTE — Telephone Encounter (Signed)
Pt has consented to a Virtual Visit and for the insurance to be billed as such. Email has been confirmed and pt was informed that copay will be billed.

## 2018-09-02 ENCOUNTER — Encounter: Payer: Self-pay | Admitting: Neurology

## 2018-09-02 ENCOUNTER — Ambulatory Visit (INDEPENDENT_AMBULATORY_CARE_PROVIDER_SITE_OTHER): Payer: 59 | Admitting: Neurology

## 2018-09-02 ENCOUNTER — Other Ambulatory Visit: Payer: Self-pay

## 2018-09-02 DIAGNOSIS — I679 Cerebrovascular disease, unspecified: Secondary | ICD-10-CM | POA: Diagnosis not present

## 2018-09-02 DIAGNOSIS — IMO0002 Reserved for concepts with insufficient information to code with codable children: Secondary | ICD-10-CM

## 2018-09-02 DIAGNOSIS — G43709 Chronic migraine without aura, not intractable, without status migrainosus: Secondary | ICD-10-CM | POA: Diagnosis not present

## 2018-09-02 MED ORDER — SUMATRIPTAN SUCCINATE 50 MG PO TABS: 50.0000 mg | ORAL_TABLET | ORAL | 11 refills | Status: DC | PRN

## 2018-09-02 MED ORDER — PROPRANOLOL HCL 40 MG PO TABS: 40.0000 mg | ORAL_TABLET | Freq: Two times a day (BID) | ORAL | 4 refills | Status: DC

## 2018-09-02 NOTE — Progress Notes (Signed)
PATIENT: Martin ContesJohn D Hodge DOB: [birth year]  No chief complaint on file.    HISTORICAL  Martin ContesJohn D Hodge is a 58 year old male, seen in refer by his primary care doctor  Burnett ShengHedrick, for evaluation of headaches, and right Bell's palsy, initial evaluation was on February 09 2017.  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  He presented to the emergency room on November 26 2016 with right facial upper and lower face droopiness, numbness, and also headache starting from right neck, right ear fullness, hypersensitivity to sound, decreased taste on the right side, dry eye dry mouth.  He was diagnosed with right Bell's palsy, at its worst, he has difficulty closing his right eye, droopiness at his right mouth corner, he was given a prescription of prednisone tapering, valacyclovir 1000 mg 3 times a day for week, he had regained quick recovery, almost 100% recovery by 2 weeks,  He had extensive evaluation of the emergency room because reported mild right hand weakness that lasts for 1 hour,   I have personally reviewed MR of the brain, not supratentorium small vessel disease  CT angiogram of neck  and neck showed no large vessel disease, echocardiogram was done, reported no significant abnormality, but I do not have formal report,  He is now taking daily aspirin, 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  Virtual Visit via Video  I connected with Martin ContesJohn D Hodge on 09/02/18 at  by Video and verified that I am speaking with the correct person using two identifiers.   I discussed the limitations, risks, security and privacy concerns of performing an evaluation and management service by video and the availability of in person appointments. I also discussed with the patient that there may be a patient responsible charge related to this service. The patient expressed understanding and agreed to proceed.  History of Present Illness: He has been doing very well since last visit in October 2018, has no recurrent lateralized motor or sensory deficit,  Chronic migraine  Doing well, he only takes Inderal 40 mg once a day  Imitrex 50 mg, Zofran as needed, once every few months  History of right Bell's palsy on November 26 2016  He regained complete recovery  Cerebral small vessel disease  He had a history of mild abnormal A1c 5.7, chronic kidney disease, GFR of 54, history of left kidney donor  Continue aspirin 81 mg daily      Observations/Objective: I have reviewed problem lists, medications, allergies.  Awake, alert, oriented to history taking and casual conversation  Assessment and Plan: Chronic migraine  Doing well, he only takes Inderal 40 mg once a day  Imitrex 50 mg, Zofran as needed, once every few months  History of right Bell's palsy on November 26 2016  He regained complete recovery  Cerebral small vessel disease  He had a history of mild abnormal  A1c 5.7, chronic kidney disease, GFR of 54, history of left kidney donor  Continue aspirin 81 mg daily      Follow Up Instructions:    As needed.   I discussed the assessment and treatment plan with the patient. The patient was provided an opportunity to ask questions and all were answered. The patient agreed with the plan and demonstrated an understanding of the instructions.   The patient  was advised to call back or seek an in-person evaluation if the symptoms worsen or if the condition fails to improve as anticipated.  I provided 15 minutes of non-face-to-face time during this encounter.   Levert Feinstein, MD

## 2018-10-22 ENCOUNTER — Encounter (HOSPITAL_COMMUNITY): Payer: Self-pay | Admitting: Emergency Medicine

## 2018-10-22 ENCOUNTER — Emergency Department
Admission: EM | Admit: 2018-10-22 | Discharge: 2018-10-22 | Disposition: A | Discharge status: Home / Self Care | Payer: 59 | Attending: Emergency Medicine | Admitting: Emergency Medicine

## 2018-10-22 ENCOUNTER — Emergency Department (HOSPITAL_COMMUNITY)
Admission: EM | Admit: 2018-10-22 | Discharge: 2018-10-23 | Disposition: A | Discharge status: Home / Self Care | Payer: 59 | Attending: Emergency Medicine | Admitting: Emergency Medicine

## 2018-10-22 ENCOUNTER — Other Ambulatory Visit: Payer: Self-pay

## 2018-10-22 DIAGNOSIS — R1030 Lower abdominal pain, unspecified: Secondary | ICD-10-CM | POA: Insufficient documentation

## 2018-10-22 DIAGNOSIS — Z79899 Other long term (current) drug therapy: Secondary | ICD-10-CM | POA: Diagnosis not present

## 2018-10-22 DIAGNOSIS — Z5321 Procedure and treatment not carried out due to patient leaving prior to being seen by health care provider: Secondary | ICD-10-CM | POA: Insufficient documentation

## 2018-10-22 DIAGNOSIS — Z7982 Long term (current) use of aspirin: Secondary | ICD-10-CM | POA: Insufficient documentation

## 2018-10-22 DIAGNOSIS — R1033 Periumbilical pain: Secondary | ICD-10-CM | POA: Insufficient documentation

## 2018-10-22 DIAGNOSIS — R110 Nausea: Secondary | ICD-10-CM | POA: Diagnosis not present

## 2018-10-22 DIAGNOSIS — Z87891 Personal history of nicotine dependence: Secondary | ICD-10-CM | POA: Diagnosis not present

## 2018-10-22 LAB — COMPREHENSIVE METABOLIC PANEL
ALT: 20 U/L (ref 0–44)
AST: 21 U/L (ref 15–41)
Albumin: 4.4 g/dL (ref 3.5–5.0)
Alkaline Phosphatase: 75 U/L (ref 38–126)
Anion gap: 9 (ref 5–15)
BUN: 22 mg/dL — ABNORMAL HIGH (ref 6–20)
CO2: 28 mmol/L (ref 22–32)
Calcium: 10.3 mg/dL (ref 8.9–10.3)
Chloride: 105 mmol/L (ref 98–111)
Creatinine, Ser: 1.48 mg/dL — ABNORMAL HIGH (ref 0.61–1.24)
GFR calc Af Amer: 60 mL/min — ABNORMAL LOW (ref >60)
GFR calc non Af Amer: 51 mL/min — ABNORMAL LOW (ref >60)
Glucose, Bld: 104 mg/dL — ABNORMAL HIGH (ref 70–99)
Potassium: 4.2 mmol/L (ref 3.5–5.1)
Sodium: 142 mmol/L (ref 135–145)
Total Bilirubin: 0.7 mg/dL (ref 0.3–1.2)
Total Protein: 7.5 g/dL (ref 6.5–8.1)

## 2018-10-22 LAB — CBC
HCT: 43.6 % (ref 39.0–52.0)
Hemoglobin: 14.7 g/dL (ref 13.0–17.0)
MCH: 30.2 pg (ref 26.0–34.0)
MCHC: 33.7 g/dL (ref 30.0–36.0)
MCV: 89.5 fL (ref 80.0–100.0)
Platelets: 275 10*3/uL (ref 150–400)
RBC: 4.87 MIL/uL (ref 4.22–5.81)
RDW: 12.8 % (ref 11.5–15.5)
WBC: 11.1 10*3/uL — ABNORMAL HIGH (ref 4.0–10.5)
nRBC: 0 % (ref 0.0–0.2)

## 2018-10-22 LAB — LIPASE, BLOOD: Lipase: 35 U/L (ref 11–51)

## 2018-10-22 NOTE — ED Triage Notes (Signed)
Pt states thirty minutes of bilateral lower abd pain. Pt denies vomiting, diarrhea, fever. Pt states pain began when he was riding a [employer], nature. Pt appears in no acute distress.

## 2018-10-22 NOTE — ED Triage Notes (Signed)
Pt in POV reporting lower abd pain onset few hrs PTA. Denies N/V/D. Pt appears to be in discomfort in triage. Was seen at [facility] but LWBS d/t wait time. All blood work results can be seen, will request urine sample from pt.

## 2018-10-23 ENCOUNTER — Emergency Department (HOSPITAL_COMMUNITY): Payer: 59

## 2018-10-23 LAB — URINALYSIS, ROUTINE W REFLEX MICROSCOPIC
Bilirubin Urine: NEGATIVE
Glucose, UA: NEGATIVE mg/dL
Hgb urine dipstick: NEGATIVE
Ketones, ur: NEGATIVE mg/dL
Leukocytes,Ua: NEGATIVE
Nitrite: NEGATIVE
Protein, ur: NEGATIVE mg/dL
Specific Gravity, Urine: 1.044 — ABNORMAL HIGH (ref 1.005–1.030)
pH: 5 (ref 5.0–8.0)

## 2018-10-23 MED ORDER — HYDROMORPHONE HCL 1 MG/ML IJ SOLN
1.0000 mg | Freq: Once | INTRAMUSCULAR | Status: AC
  Administered 2018-10-23: 1 mg via INTRAVENOUS
  Filled 2018-10-23: qty 1

## 2018-10-23 MED ORDER — IOHEXOL 300 MG/ML  SOLN
100.0000 mL | Freq: Once | INTRAMUSCULAR | Status: AC | PRN
  Administered 2018-10-23: 100 mL via INTRAVENOUS

## 2018-10-23 MED ORDER — SODIUM CHLORIDE 0.9 % IV BOLUS
1000.0000 mL | Freq: Once | INTRAVENOUS | Status: AC
  Administered 2018-10-23: 1000 mL via INTRAVENOUS

## 2018-10-23 MED ORDER — ONDANSETRON 4 MG PO TBDP: 4.0000 mg | ORAL_TABLET | Freq: Three times a day (TID) | ORAL | 0 refills | Status: DC | PRN

## 2018-10-23 MED ORDER — ONDANSETRON HCL 4 MG/2ML IJ SOLN
4.0000 mg | Freq: Once | INTRAMUSCULAR | Status: AC
  Administered 2018-10-23: 4 mg via INTRAVENOUS
  Filled 2018-10-23: qty 2

## 2018-10-23 NOTE — ED Provider Notes (Signed)
[facility] EMERGENCY DEPARTMENT Provider Note   CSN: [id number] Arrival date & time: 10/22/18  2254     History   Chief Complaint Chief Complaint  Patient presents with  . Abdominal Pain    HPI Martin Hodge is a 58 y.o. male.     Patient presents to the emergency department with a chief complaint of lower abdominal pain.  He states his symptoms started earlier this evening.  He reports associated nausea, but denies any vomiting.  Denies any fever.  Denies any dysuria or hematuria.  He states that he was seen earlier at [place] and had laboratory work done there, but left due to the wait time and came to [facility].  He states that his pain is moderate to severe.  Nothing makes it better or worse.  Reports that he has a single kidney.  The history is provided by the patient. No language interpreter was used.    Past Medical History:  Diagnosis Date  . Bell's palsy   . GERD (gastroesophageal reflux disease)   . Headache     Patient Active Problem List   Diagnosis Date Noted  . Chronic migraine 02/09/2017  . Small vessel disease, cerebrovascular 02/09/2017  . Right sided weakness 11/26/2016  . Biliary dyskinesia 01/21/2011    Past Surgical History:  Procedure Laterality Date  . BACK SURGERY  1997   disk surgery in lower back   . CHOLECYSTECTOMY    . left kidney donatioin Left   . MEDIAL COLLATERAL LIGAMENT REPAIR, KNEE  2009        Home Medications    Prior to Admission medications   Medication Sig Start Date End Date Taking? Authorizing Provider  aspirin 81 MG tablet Take 1 tablet (81 mg total) by mouth daily. 11/28/16   Fritzi Mandes, MD  Multiple Vitamin (MULTIVITAMIN PO) Take 1 tablet by mouth daily.     [provider]  ondansetron (ZOFRAN ODT) 4 MG disintegrating tablet Take 1 tablet (4 mg total) by mouth every 8 (eight) hours as needed. 10/23/18   Montine Circle, PA-C  propranolol (INDERAL) 40 MG tablet Take 1 tablet (40 mg  total) by mouth 2 (two) times daily. 09/02/18   Marcial Pacas, MD  SUMAtriptan (IMITREX) 50 MG tablet Take 1 tablet (50 mg total) by mouth every 2 (two) hours as needed for migraine. May repeat in 2 hours if headache persists or recurs. 09/02/18   Marcial Pacas, MD    Family History Family History  Problem Relation Age of Onset  . Stroke Mother   . Heart disease Father   . Heart attack Father   . COPD Father     Social History Social History   Tobacco Use  . Smoking status: Former Smoker    Quit date: 1984    Years since quitting: 36.4  . Smokeless tobacco: Never Used  Substance Use Topics  . Alcohol use: Yes    Comment: social drinker - maybe 12 per month  . Drug use: No     Allergies   Ibuprofen and Sulfa antibiotics   Review of Systems Review of Systems  All other systems reviewed and are negative.    Physical Exam Updated Vital Signs BP 127/84 (BP Location: Right Arm)   Pulse 73   Temp 98.7 F (37.1 C) (Oral)   Resp 17   Ht 5\' 9"  (1.753 m)   Wt 88.5 kg   SpO2 96%   BMI 28.80 kg/m  Physical Exam Vitals signs and nursing note reviewed.  Constitutional:      Appearance: He is well-developed.  HENT:     Head: Normocephalic and atraumatic.  Eyes:     Conjunctiva/sclera: Conjunctivae normal.  Neck:     Musculoskeletal: Neck supple.  Cardiovascular:     Rate and Rhythm: Normal rate and regular rhythm.     Heart sounds: No murmur.  Pulmonary:     Effort: Pulmonary effort is normal. No respiratory distress.     Breath sounds: Normal breath sounds.  Abdominal:     Palpations: Abdomen is soft.     Tenderness: There is abdominal tenderness in the suprapubic area.  Skin:    General: Skin is warm and dry.  Neurological:     Mental Status: He is alert.  Psychiatric:        Mood and Affect: Mood normal.        Behavior: Behavior normal.      ED Treatments / Results  Labs (all labs ordered are listed, but only abnormal results are displayed) Labs  Reviewed  URINALYSIS, ROUTINE W REFLEX MICROSCOPIC - Abnormal; Notable for the following components:      Result Value   Specific Gravity, Urine 1.044 (*)    All other components within normal limits    EKG    Radiology Ct Abdomen Pelvis W Contrast  Result Date: 10/23/2018 CLINICAL DATA:  Abdominal pain EXAM: CT ABDOMEN AND PELVIS WITH CONTRAST TECHNIQUE: Multidetector CT imaging of the abdomen and pelvis was performed using the standard protocol following bolus administration of intravenous contrast. CONTRAST:  100mL OMNIPAQUE IOHEXOL 300 MG/ML  SOLN COMPARISON:  None. FINDINGS: Lower chest: Lung bases are clear. No effusions. Heart is normal size. Hepatobiliary: No focal liver abnormality is seen. Status post cholecystectomy. No biliary dilatation. Pancreas: No focal abnormality or ductal dilatation. Spleen: No focal abnormality.  Normal size. Adrenals/Urinary Tract: Prior left nephrectomy. No adrenal or renal mass. No hydronephrosis. Urinary bladder unremarkable. Stomach/Bowel: Stomach, large and small bowel grossly unremarkable. Vascular/Lymphatic: No evidence of aneurysm or adenopathy. Reproductive: No visible focal abnormality. Other: No free fluid or free air. Musculoskeletal: No acute bony abnormality. Degenerative disc disease in the lower lumbar spine. IMPRESSION: No acute findings in the abdomen or pelvis. Electronically Signed   By: Charlett NoseKevin  Dover M.D.   On: 10/23/2018 01:11    Procedures Procedures (including critical care time)  Medications Ordered in ED Medications  HYDROmorphone (DILAUDID) injection 1 mg (1 mg Intravenous Given 10/23/18 0026)  ondansetron (ZOFRAN) injection 4 mg (4 mg Intravenous Given 10/23/18 0024)  sodium chloride 0.9 % bolus 1,000 mL (0 mLs Intravenous Stopped 10/23/18 0243)  iohexol (OMNIPAQUE) 300 MG/ML solution 100 mL (100 mLs Intravenous Contrast Given 10/23/18 0046)  HYDROmorphone (DILAUDID) injection 1 mg (1 mg Intravenous Given 10/23/18 0241)      Initial Impression / Assessment and Plan / ED Course  I have reviewed the triage vital signs and the nursing notes.  Pertinent labs & imaging results that were available during my care of the patient were reviewed by me and considered in my medical decision making (see chart for details).        Patient with lower abdominal pain that started tonight.  [facility].  Creatinine is stable.  We will check CT abdomen pelvis.  CT is negative.  Urinalysis negative.  No clear explanation for the patient's pain.  Recommend watchful waiting.  Patient will return for new or worsening  symptoms.  He has some pain medicine at home which he can take.  Discussed close return precautions.    Final Clinical Impressions(s) / ED Diagnoses   Final diagnoses:  Lower abdominal pain    ED Discharge Orders         Ordered    ondansetron (ZOFRAN ODT) 4 MG disintegrating tablet  Every 8 hours PRN     10/23/18 0247           Roxy HorsemanBrowning, Garold Sheeler, PA-C 10/23/18 0426    Ward, Layla MawKristen N, DO 10/23/18 [phone]

## 2018-10-23 NOTE — ED Notes (Signed)
Taken to CT.

## 2018-10-23 NOTE — Discharge Instructions (Signed)
No clear explanation was found for your abdominal pain tonight.  Please monitor your symptoms closely, and return to the emergency department if you have new or worsening symptoms, or if the symptoms persist.  Pay particular attention to fever, bloody vomit, bloody diarrhea, blood in your urine, or focal abdominal pain.

## 2019-07-29 IMAGING — CT CT ABDOMEN AND PELVIS WITH CONTRAST
2 of 5 series · 16 of 46 positions shown, 18 images · IV contrast (APPLIED)
Comparison: None.

CLINICAL DATA: Abdominal pain

EXAM:
CT ABDOMEN AND PELVIS WITH CONTRAST
TECHNIQUE: Multidetector CT imaging of the abdomen and pelvis was performed
using the standard protocol following bolus administration of
intravenous contrast.
CONTRAST:  100mL OMNIPAQUE IOHEXOL 300 MG/ML  SOLN

[Series 3: abdomen 5.0 · axial · 0.94mm/px · z∈[-802,-372]mm · 13 of 102 slices shown, 15 images]
[im 8/102  soft-tissue]
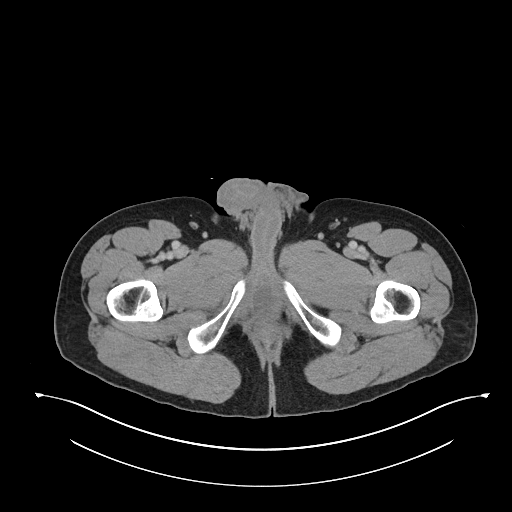
[im 8/102  bone]
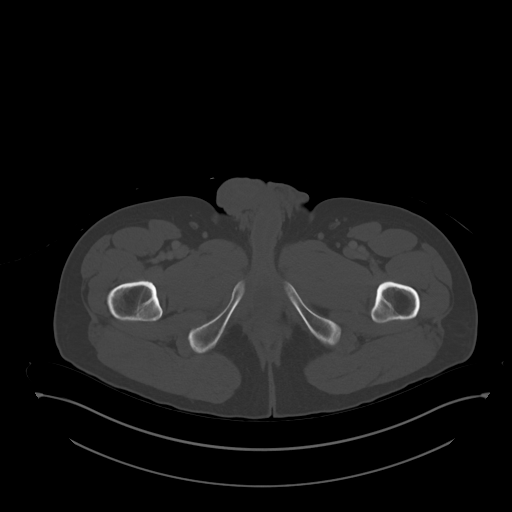
[im 15/102  soft-tissue]
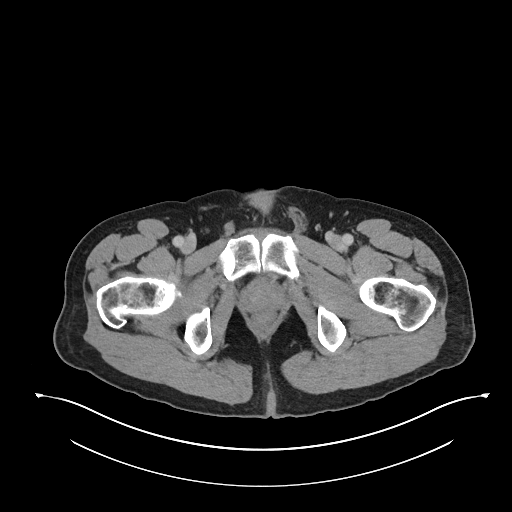
[im 22/102  soft-tissue]
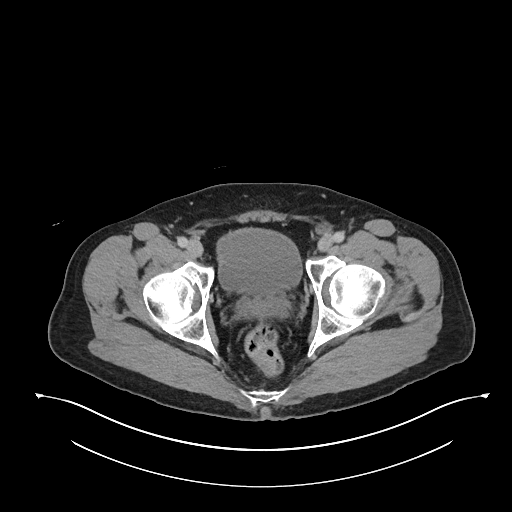
[im 29/102  soft-tissue]
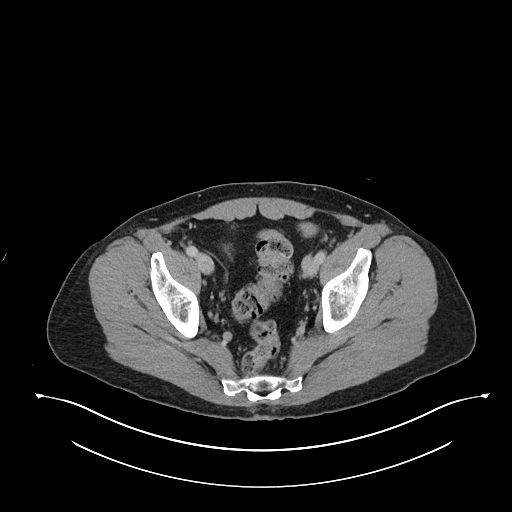
[im 37/102  soft-tissue]
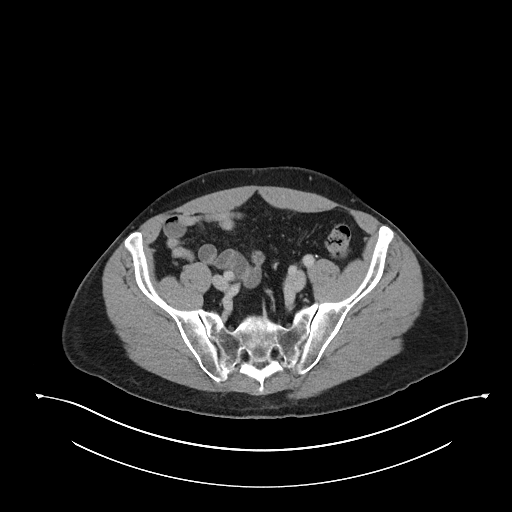
[im 44/102  soft-tissue]
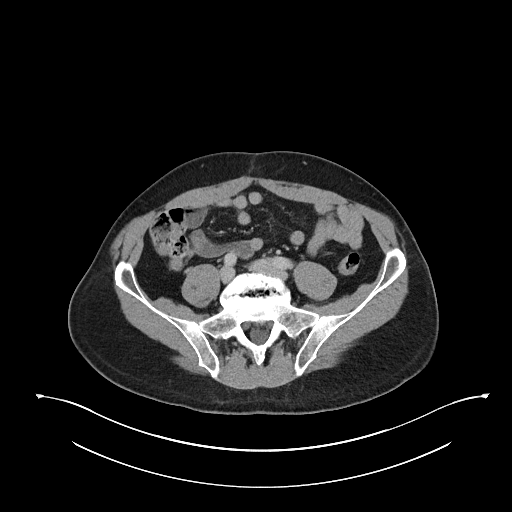
[im 51/102  soft-tissue]
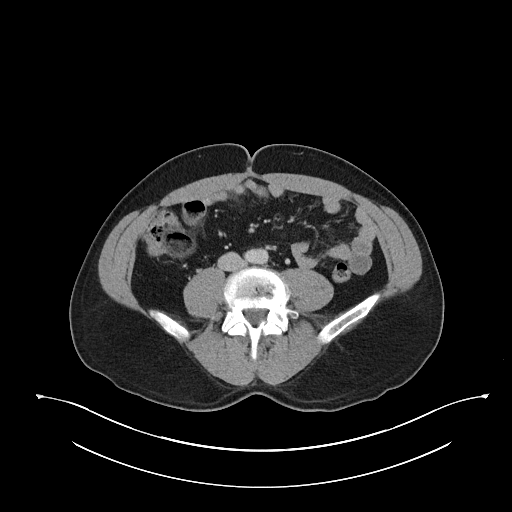
[im 58/102  soft-tissue]
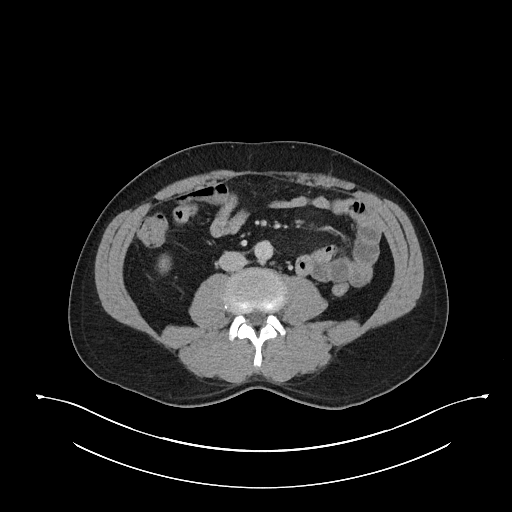
[im 65/102  soft-tissue]
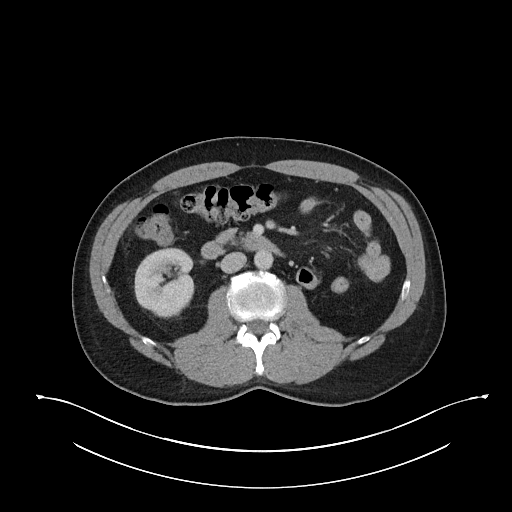
[im 65/102  bone]
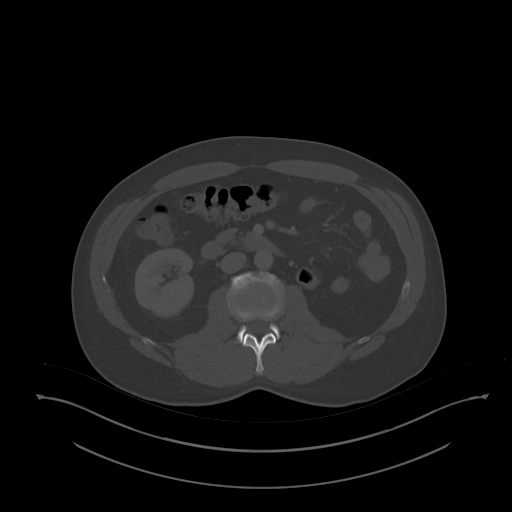
[im 73/102  soft-tissue]
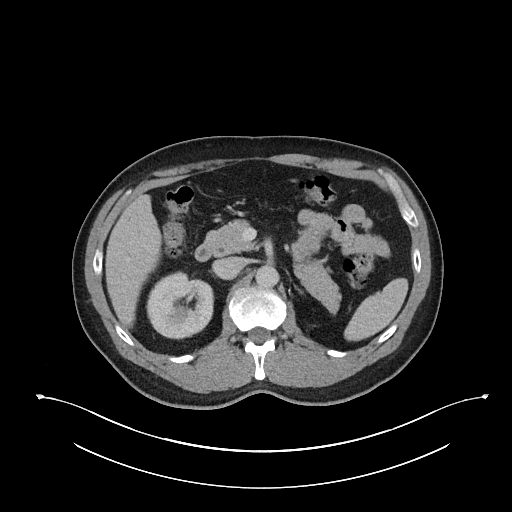
[im 80/102  soft-tissue]
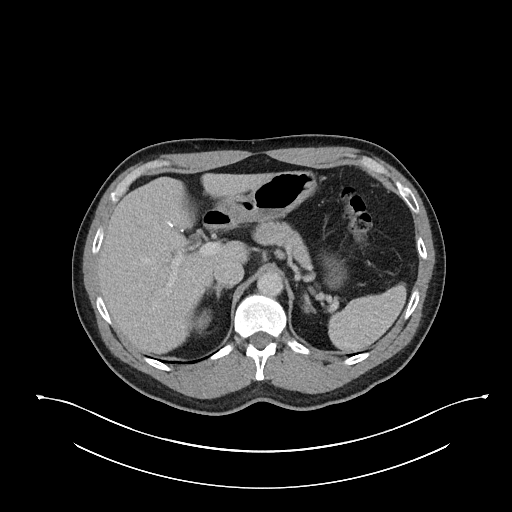
[im 87/102  soft-tissue]
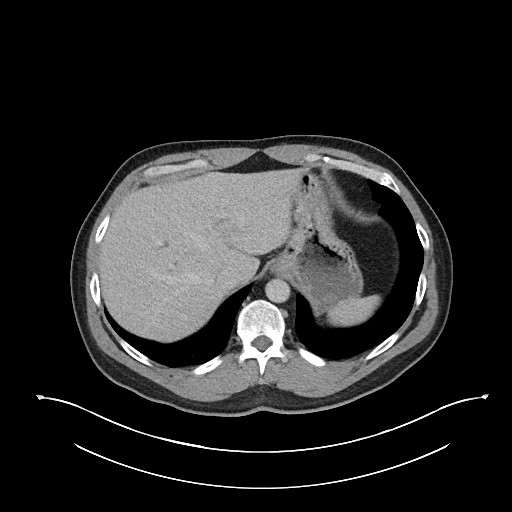
[im 94/102  soft-tissue]
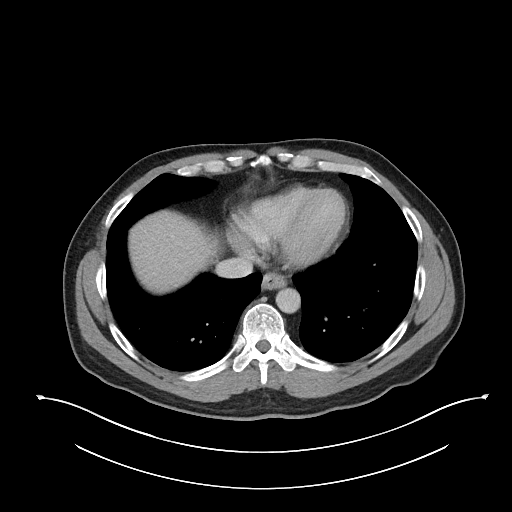

[Series 6: abdomen 3.0 mpr cor · coronal · 0.75mm/px · 3 of 101 slices shown]
[im 34/101  soft-tissue]
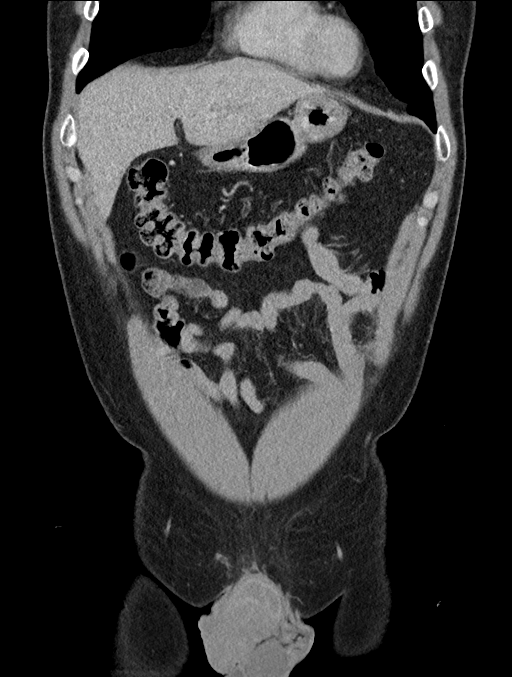
[im 45/101  soft-tissue]
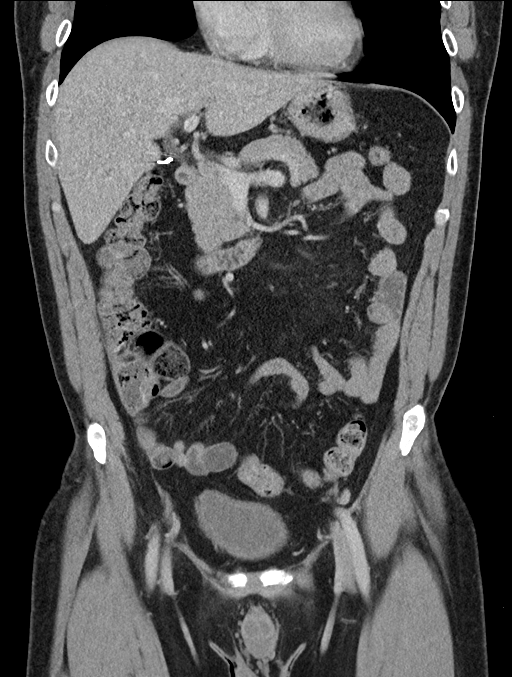
[im 56/101  soft-tissue]
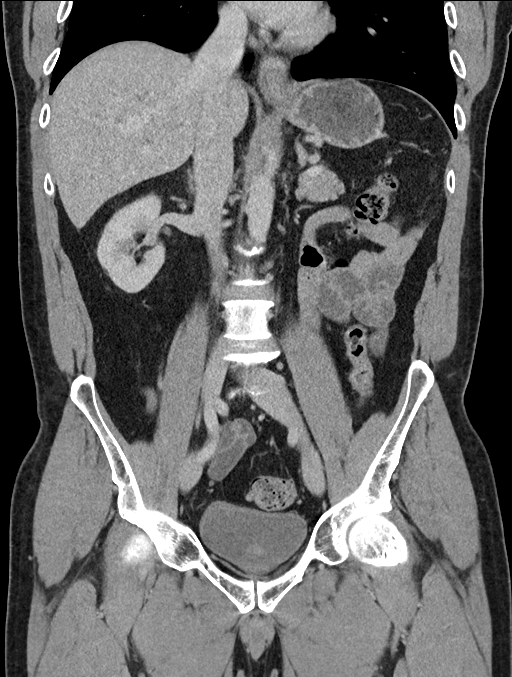

[16 of 46 positions shown; findings below may reference images not displayed]

FINDINGS: Lower chest: Lung bases are clear. No effusions. Heart is normal
size.

Hepatobiliary: No focal liver abnormality is seen. Status post
cholecystectomy. No biliary dilatation.

Pancreas: No focal abnormality or ductal dilatation.

Spleen: No focal abnormality.  Normal size.

Adrenals/Urinary Tract: Prior left nephrectomy. No adrenal or renal
mass. No hydronephrosis. Urinary bladder unremarkable.

Stomach/Bowel: Stomach, large and small bowel grossly unremarkable.

Vascular/Lymphatic: No evidence of aneurysm or adenopathy.

Reproductive: No visible focal abnormality.

Other: No free fluid or free air.

Musculoskeletal: No acute bony abnormality. Degenerative disc
disease in the lower lumbar spine.
IMPRESSION: No acute findings in the abdomen or pelvis.

## 2019-09-10 ENCOUNTER — Other Ambulatory Visit: Payer: Self-pay | Admitting: Neurology

## 2019-09-19 ENCOUNTER — Other Ambulatory Visit: Payer: Self-pay | Admitting: Neurology

## 2020-07-02 ENCOUNTER — Other Ambulatory Visit: Payer: Self-pay | Admitting: Nephrology

## 2020-07-02 DIAGNOSIS — N1831 Chronic kidney disease, stage 3a: Secondary | ICD-10-CM

## 2020-07-05 ENCOUNTER — Ambulatory Visit
Admission: RE | Admit: 2020-07-05 | Discharge: 2020-07-05 | Disposition: A | Discharge status: Home / Self Care | Payer: 59 | Source: Ambulatory Visit | Attending: Nephrology | Admitting: Nephrology

## 2020-07-05 ENCOUNTER — Other Ambulatory Visit: Payer: Self-pay

## 2020-07-05 DIAGNOSIS — N1831 Chronic kidney disease, stage 3a: Secondary | ICD-10-CM | POA: Diagnosis not present

## 2021-04-10 IMAGING — US US RENAL
1 series · 14 of 25 positions shown · non-contrast
Comparison: CT abdomen/pelvis dated 10/23/2018

CLINICAL DATA: Stage IIIA chronic kidney disease

EXAM:
RENAL / URINARY TRACT ULTRASOUND COMPLETE

[Series 1: us renal · 0.26mm/px · 14 of 67 slices shown]
[im 1/67]
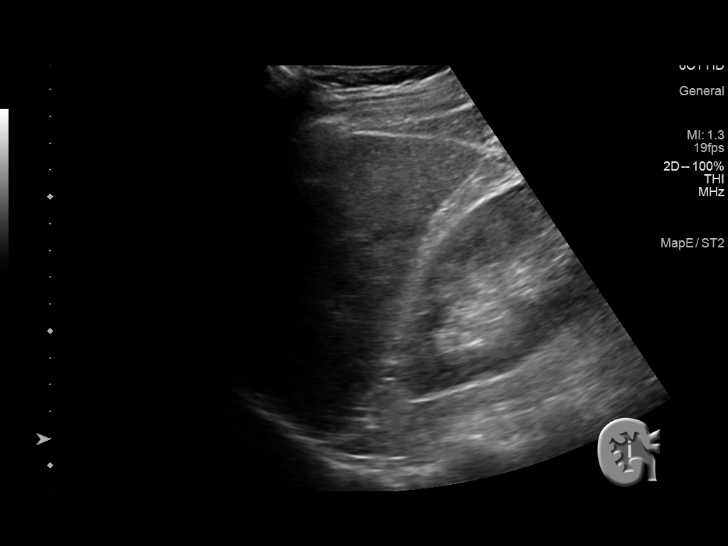
[im 6/67]
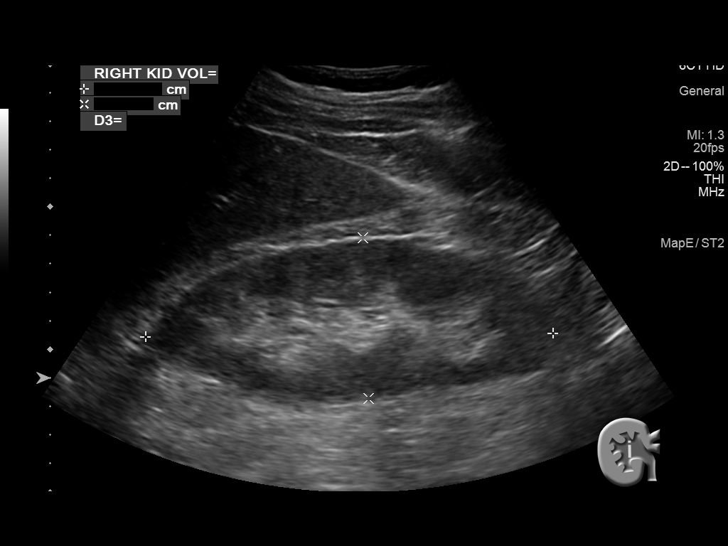
[im 12/67]
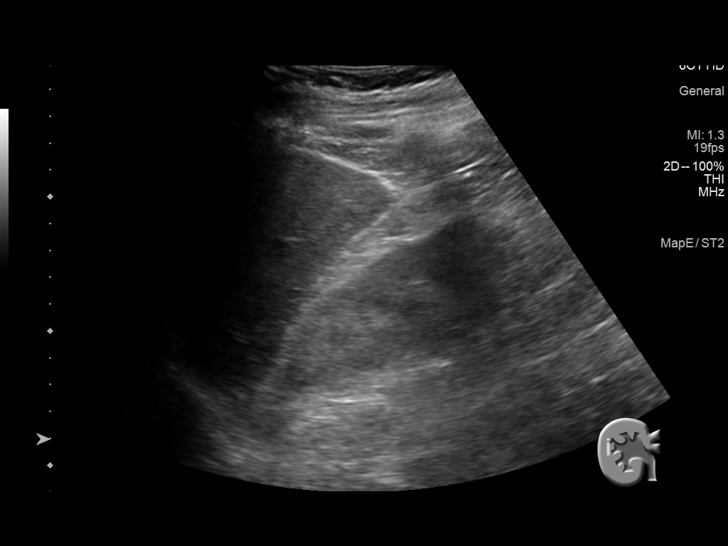
[im 17/67]
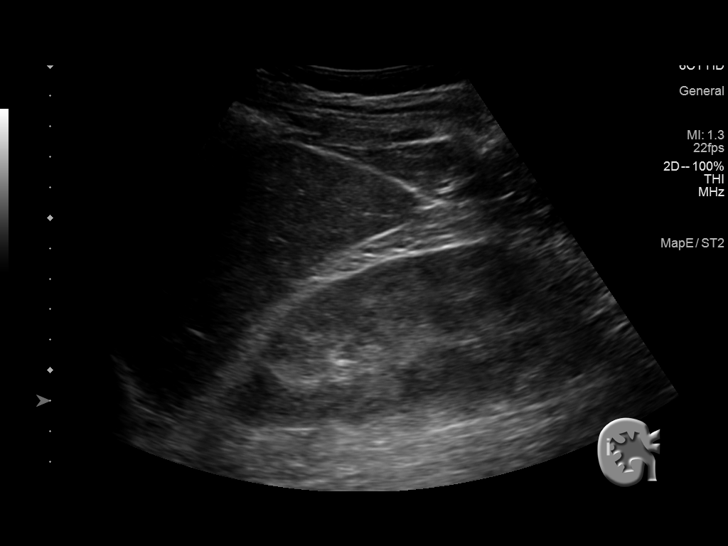
[im 23/67]
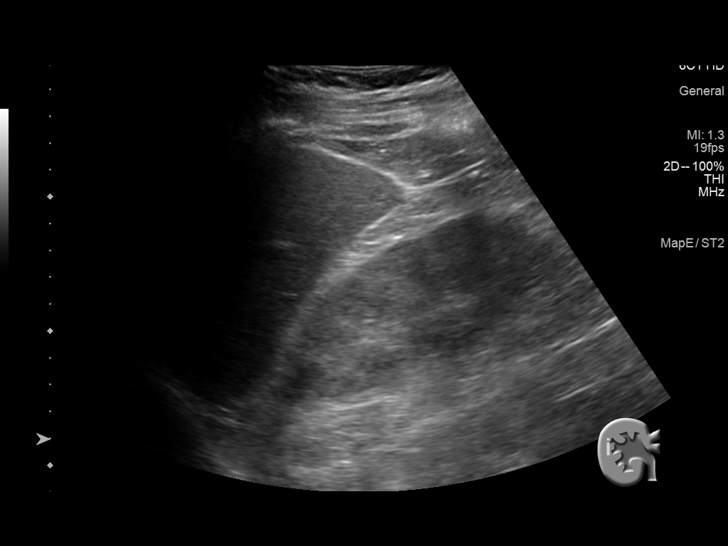
[im 25/67]
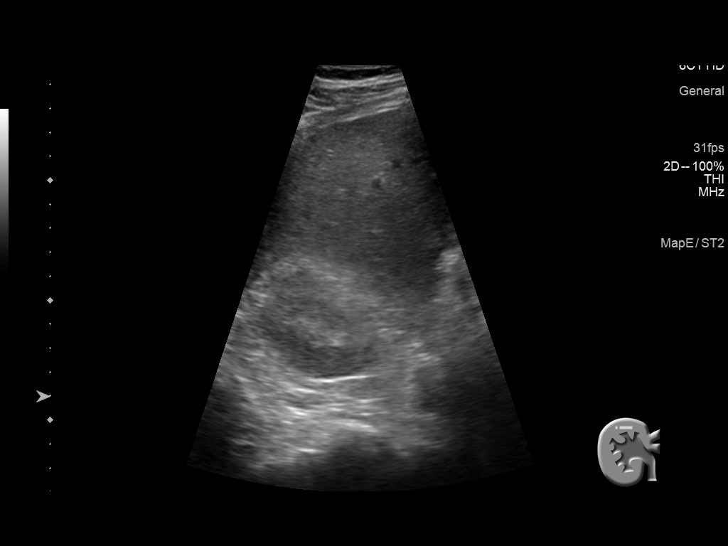
[im 31/67]
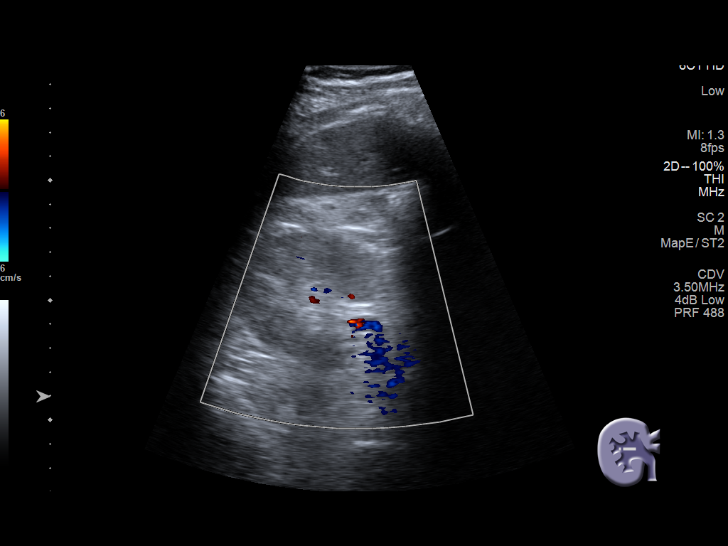
[im 36/67]
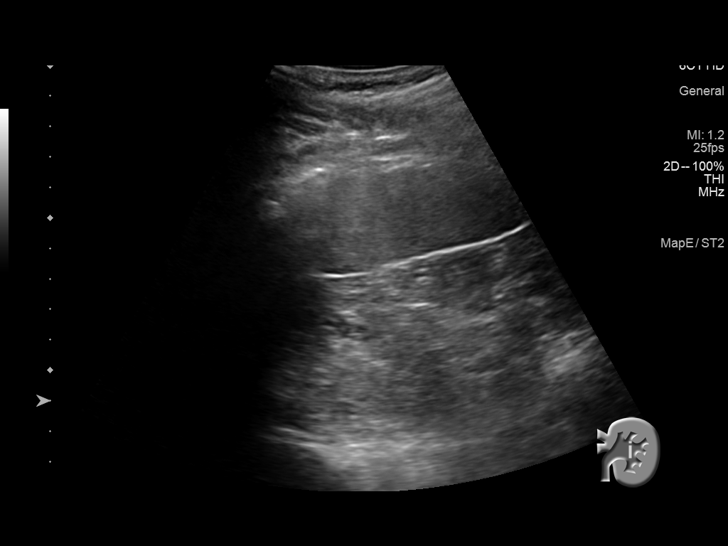
[im 42/67]
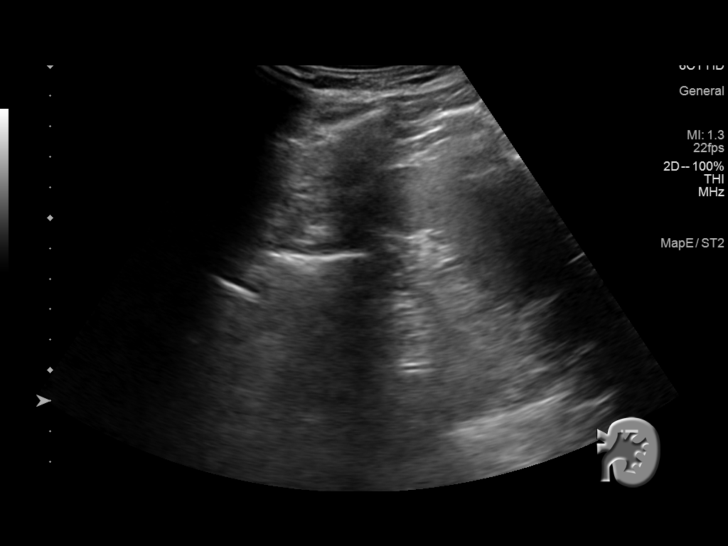
[im 45/67]
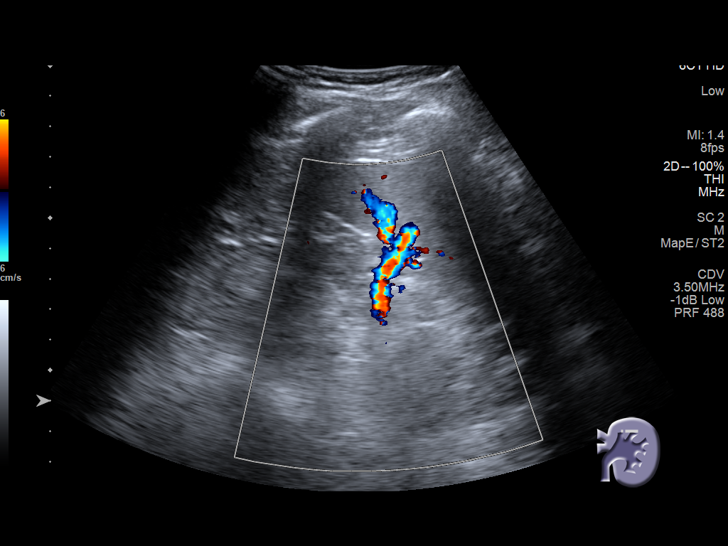
[im 50/67]
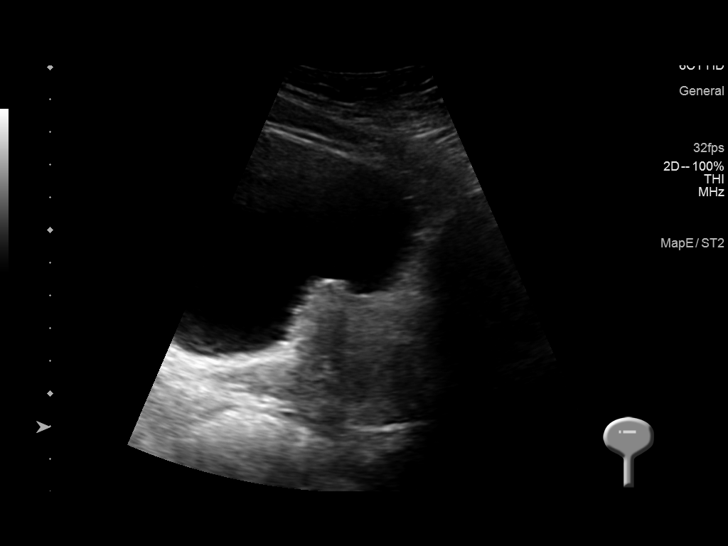
[im 56/67]
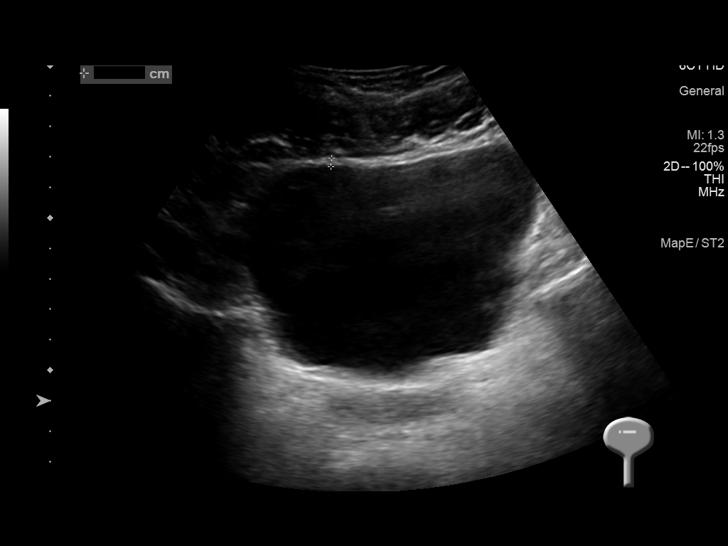
[im 61/67]
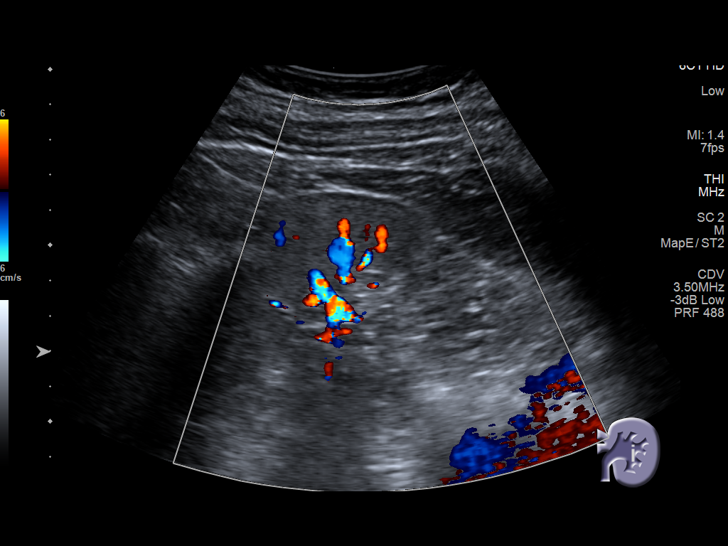
[im 67/67]
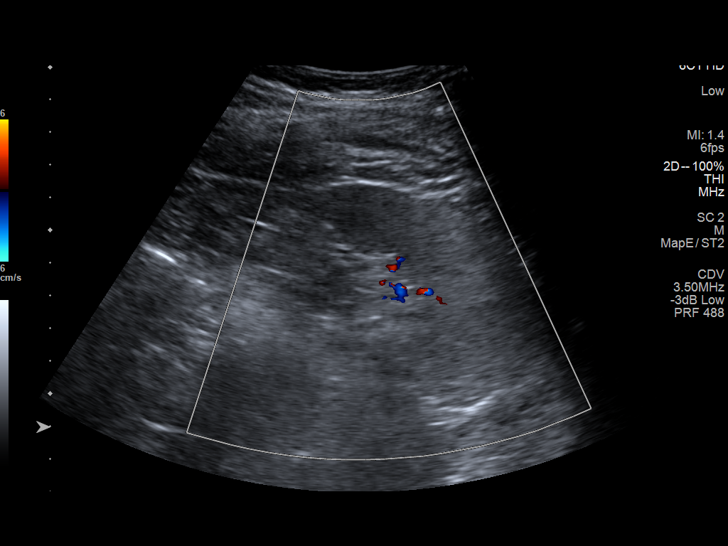

[14 of 25 positions shown; findings below may reference images not displayed]

FINDINGS: Right Kidney:

Renal measurements: 14.3 x 5.6 x 5.5 cm = volume: 233 mL.
Echogenicity within normal limits. No mass or hydronephrosis
visualized.

Left Kidney:

Surgically absent.

Bladder:

Appears normal for degree of bladder distention.

Other:

Mild prostatomegaly.
IMPRESSION: Right kidney is within normal limits.

Status post left nephrectomy.

## 2021-09-04 ENCOUNTER — Ambulatory Visit (INDEPENDENT_AMBULATORY_CARE_PROVIDER_SITE_OTHER): Payer: BC Managed Care – PPO | Admitting: Orthopaedic Surgery

## 2021-09-04 ENCOUNTER — Ambulatory Visit (INDEPENDENT_AMBULATORY_CARE_PROVIDER_SITE_OTHER): Payer: BC Managed Care – PPO

## 2021-09-04 DIAGNOSIS — M25561 Pain in right knee: Secondary | ICD-10-CM | POA: Diagnosis not present

## 2021-09-04 DIAGNOSIS — G8929 Other chronic pain: Secondary | ICD-10-CM

## 2021-09-04 MED ORDER — LIDOCAINE HCL 1 % IJ SOLN
3.0000 mL | INTRAMUSCULAR | Status: AC | PRN
  Administered 2021-09-04: 3 mL

## 2021-09-04 MED ORDER — METHYLPREDNISOLONE ACETATE 40 MG/ML IJ SUSP
40.0000 mg | INTRAMUSCULAR | Status: AC | PRN
  Administered 2021-09-04: 40 mg via INTRA_ARTICULAR

## 2021-09-04 NOTE — Progress Notes (Signed)
? ? ? ? ? ? ? ? ? ? ? ? ? ? ? ? ? ?Office Visit Note ?  ?Patient: Martin Hodge           ?Date of Birth: 07-07-1960           ?MRN: [id number] ?Visit Date: 09/04/2021 ?             ?Requested by: Maryland Pink, MD ?[place] ?[facility] ?[place],  [place] [id number] ?PCP: Maryland Pink, MD ? ? ?Assessment & Plan: ?Visit Diagnoses:  ?1. Chronic pain of right knee   ? ? ?Plan: Obviously we will try conservative treatment first.  I recommended an intra-articular steroid injection in his right knee and he agreed to this and tolerated it well.  I will see him back in about 3 weeks.  If if things or not improving the next step would be a MRI.  He does not need physical therapy since his quad muscles and calf muscles incredibly strong and his range of motion is full. ? ?Follow-Up Instructions: Return in about 3 weeks (around 09/25/2021).  ? ?Orders:  ?Orders Placed This Encounter  ?Procedures  ? Large Joint Inj  ? XR Knee 1-2 Views Right  ? ?No orders of the defined types were placed in this encounter. ? ? ? ? Procedures: ?Large Joint Inj: R knee on 09/04/2021 2:29 PM ?Indications: diagnostic evaluation and pain ?Details: 22 G 1.5 in needle, superolateral approach ? ?Arthrogram: No ? ?Medications: 3 mL lidocaine 1 %; 40 mg methylPREDNISolone acetate 40 MG/ML ?Outcome: tolerated well, no immediate complications ?Procedure, treatment alternatives, risks and benefits explained, specific risks discussed. Consent was given by the patient. Immediately prior to procedure a time out was called to verify the correct patient, procedure, equipment, support staff and site/side marked as required. Patient was prepped and draped in the usual sterile fashion.  ? ? ? ? ?Clinical Data: ?No additional findings. ? ? ?Subjective: ?Chief Complaint  ?Patient presents with  ? Right Knee - Pain  ?The patient is a 61 year old gentleman that I have seen remotely in the past for his left knee.  He comes in with right knee pain this been on  and off for some time now but slowly getting worse and he points to the medial aspect of his knee as a source of his pain.  Sometimes he feels like it may give out on him.  He denies any specific locking catching but he certainly started to have some issues with his knee.  He plays golf and pickleball.  He is very active and does lift weights as well.  He is never had surgery on the right knee nor the injection of the right knee.  He does report swelling intermittently with the right knee.  He is not a diabetic and very healthy. ? ?HPI ? ?Review of Systems ?There is listed no fever, chills, nausea, vomiting ? ?Objective: ?Vital Signs: There were no vitals taken for this visit. ? ?Physical Exam ?He is alert and orient x3 in no acute distress ?Ortho Exam ?Examination of his right knee shows slight varus malalignment.  There is no effusion and his Lachman's and Murray's are negative but he does have significant medial joint line tenderness of the right knee. ?Specialty Comments:  ?No specialty comments available. ? ?Imaging: ?XR Knee 1-2 Views Right ? ?Result Date: 09/04/2021 ?2 views of the right knee show no acute findings.  There is slight medial joint space narrowing and  some calcifications around the meniscus.  There is also small osteophyte of the medial compartment and the patellofemoral joint.  ? ? ?PMFS History: ?Patient Active Problem List  ? Diagnosis Date Noted  ? Chronic migraine 02/09/2017  ? Small vessel disease, cerebrovascular 02/09/2017  ? Right sided weakness 11/26/2016  ? Biliary dyskinesia 01/21/2011  ? ?Past Medical History:  ?Diagnosis Date  ? Bell's palsy   ? GERD (gastroesophageal reflux disease)   ? Headache   ?  ?Family History  ?Problem Relation Age of Onset  ? Stroke Mother   ? Heart disease Father   ? Heart attack Father   ? COPD Father   ?  ?Past Surgical History:  ?Procedure Laterality Date  ? BACK SURGERY  1997  ? disk surgery in lower back   ? CHOLECYSTECTOMY    ? left kidney donatioin  Left   ? MEDIAL COLLATERAL LIGAMENT REPAIR, KNEE  2009  ? ?Social History  ? ?Occupational History  ? Not on file  ?Tobacco Use  ? Smoking status: Former  ?  Types: Cigarettes  ?  Quit date: 76  ?  Years since quitting: 39.3  ? Smokeless tobacco: Never  ?Vaping Use  ? Vaping Use: Never used  ?Substance and Sexual Activity  ? Alcohol use: Yes  ?  Comment: social drinker - maybe 12 per month  ? Drug use: No  ? Sexual activity: Not on file  ? ? ? ? ? ? ? ? ? ? ? ? ? ? ? ? ? ? ? ?

## 2021-09-25 ENCOUNTER — Ambulatory Visit: Payer: 59 | Admitting: Orthopaedic Surgery

## 2021-10-31 DIAGNOSIS — N1831 Chronic kidney disease, stage 3a: Secondary | ICD-10-CM | POA: Insufficient documentation

## 2022-04-10 DIAGNOSIS — E785 Hyperlipidemia, unspecified: Secondary | ICD-10-CM | POA: Diagnosis not present

## 2022-04-17 DIAGNOSIS — R5381 Other malaise: Secondary | ICD-10-CM | POA: Diagnosis not present

## 2022-04-17 DIAGNOSIS — R6882 Decreased libido: Secondary | ICD-10-CM | POA: Diagnosis not present

## 2022-04-17 DIAGNOSIS — N1831 Chronic kidney disease, stage 3a: Secondary | ICD-10-CM | POA: Diagnosis not present

## 2022-04-17 DIAGNOSIS — E785 Hyperlipidemia, unspecified: Secondary | ICD-10-CM | POA: Diagnosis not present

## 2022-05-29 DIAGNOSIS — R7989 Other specified abnormal findings of blood chemistry: Secondary | ICD-10-CM | POA: Diagnosis not present

## 2022-07-29 DIAGNOSIS — E291 Testicular hypofunction: Secondary | ICD-10-CM | POA: Diagnosis not present

## 2022-07-29 DIAGNOSIS — R7989 Other specified abnormal findings of blood chemistry: Secondary | ICD-10-CM | POA: Diagnosis not present

## 2022-08-29 DIAGNOSIS — Z125 Encounter for screening for malignant neoplasm of prostate: Secondary | ICD-10-CM | POA: Diagnosis not present

## 2022-08-29 DIAGNOSIS — E291 Testicular hypofunction: Secondary | ICD-10-CM | POA: Diagnosis not present

## 2022-08-29 DIAGNOSIS — R7989 Other specified abnormal findings of blood chemistry: Secondary | ICD-10-CM | POA: Diagnosis not present

## 2022-10-17 DIAGNOSIS — R7989 Other specified abnormal findings of blood chemistry: Secondary | ICD-10-CM | POA: Diagnosis not present

## 2022-12-08 DIAGNOSIS — M79661 Pain in right lower leg: Secondary | ICD-10-CM | POA: Diagnosis not present

## 2022-12-08 DIAGNOSIS — R2241 Localized swelling, mass and lump, right lower limb: Secondary | ICD-10-CM | POA: Diagnosis not present

## 2023-02-03 DIAGNOSIS — N1831 Chronic kidney disease, stage 3a: Secondary | ICD-10-CM | POA: Diagnosis not present

## 2023-02-03 DIAGNOSIS — Z905 Acquired absence of kidney: Secondary | ICD-10-CM | POA: Diagnosis not present

## 2023-02-03 DIAGNOSIS — E291 Testicular hypofunction: Secondary | ICD-10-CM | POA: Diagnosis not present

## 2023-02-03 DIAGNOSIS — Z0000 Encounter for general adult medical examination without abnormal findings: Secondary | ICD-10-CM | POA: Diagnosis not present

## 2023-04-21 DIAGNOSIS — G5601 Carpal tunnel syndrome, right upper limb: Secondary | ICD-10-CM | POA: Diagnosis not present

## 2023-04-21 DIAGNOSIS — M65331 Trigger finger, right middle finger: Secondary | ICD-10-CM | POA: Diagnosis not present

## 2023-04-21 DIAGNOSIS — M65341 Trigger finger, right ring finger: Secondary | ICD-10-CM | POA: Diagnosis not present

## 2023-04-22 DIAGNOSIS — M65321 Trigger finger, right index finger: Secondary | ICD-10-CM | POA: Diagnosis not present

## 2023-04-22 DIAGNOSIS — G5601 Carpal tunnel syndrome, right upper limb: Secondary | ICD-10-CM | POA: Diagnosis not present

## 2023-04-22 DIAGNOSIS — M65341 Trigger finger, right ring finger: Secondary | ICD-10-CM | POA: Diagnosis not present

## 2023-04-22 DIAGNOSIS — M65331 Trigger finger, right middle finger: Secondary | ICD-10-CM | POA: Diagnosis not present

## 2023-04-23 DIAGNOSIS — Z125 Encounter for screening for malignant neoplasm of prostate: Secondary | ICD-10-CM | POA: Diagnosis not present

## 2023-04-23 DIAGNOSIS — Z13220 Encounter for screening for lipoid disorders: Secondary | ICD-10-CM | POA: Diagnosis not present

## 2023-04-23 DIAGNOSIS — E291 Testicular hypofunction: Secondary | ICD-10-CM | POA: Diagnosis not present

## 2023-04-23 DIAGNOSIS — Z0000 Encounter for general adult medical examination without abnormal findings: Secondary | ICD-10-CM | POA: Diagnosis not present

## 2023-04-23 DIAGNOSIS — Z131 Encounter for screening for diabetes mellitus: Secondary | ICD-10-CM | POA: Diagnosis not present

## 2023-04-27 ENCOUNTER — Ambulatory Visit: Payer: BC Managed Care – PPO | Admitting: Physician Assistant

## 2023-04-30 DIAGNOSIS — R972 Elevated prostate specific antigen [PSA]: Secondary | ICD-10-CM | POA: Diagnosis not present

## 2023-04-30 DIAGNOSIS — N1831 Chronic kidney disease, stage 3a: Secondary | ICD-10-CM | POA: Diagnosis not present

## 2023-04-30 DIAGNOSIS — D72829 Elevated white blood cell count, unspecified: Secondary | ICD-10-CM | POA: Diagnosis not present

## 2023-04-30 DIAGNOSIS — R7303 Prediabetes: Secondary | ICD-10-CM | POA: Diagnosis not present

## 2023-05-08 DIAGNOSIS — M79641 Pain in right hand: Secondary | ICD-10-CM | POA: Diagnosis not present

## 2023-06-01 DIAGNOSIS — N1831 Chronic kidney disease, stage 3a: Secondary | ICD-10-CM | POA: Diagnosis not present

## 2023-06-01 DIAGNOSIS — D72829 Elevated white blood cell count, unspecified: Secondary | ICD-10-CM | POA: Diagnosis not present

## 2023-06-01 DIAGNOSIS — R972 Elevated prostate specific antigen [PSA]: Secondary | ICD-10-CM | POA: Diagnosis not present

## 2023-06-01 DIAGNOSIS — Z0000 Encounter for general adult medical examination without abnormal findings: Secondary | ICD-10-CM | POA: Diagnosis not present

## 2023-06-05 ENCOUNTER — Ambulatory Visit: Payer: BC Managed Care – PPO | Admitting: Urology

## 2023-06-05 DIAGNOSIS — R972 Elevated prostate specific antigen [PSA]: Secondary | ICD-10-CM

## 2023-06-05 DIAGNOSIS — R7989 Other specified abnormal findings of blood chemistry: Secondary | ICD-10-CM

## 2023-06-05 NOTE — Progress Notes (Signed)
I,Amy L Pierron,acting as a scribe for Vanna Scotland, MD.,have documented all relevant documentation on the behalf of Vanna Scotland, MD,as directed by  Vanna Scotland, MD while in the presence of Vanna Scotland, MD.  06/05/2023 8:23 PM   Martin Hodge [birth year] [id number]  Referring provider: Jerl Mina, MD [address] [facility] [place],  [place] [address]  Chief Complaint  Patient presents with   Establish Care   Elevated PSA    HPI: 63 year-old male presents today for further evaluation of elevated PSA.  His most recent PSA on 06/01/2023 was 5.2 which is elevated from 2.35 back on 01/02/2022.  He said there is no family history of prostate cancer.   He has been having a split stream and a slow start to urination which has been happening for awhile.   He has been on testosterone for about 4 months. His level was about 500 and he had symptoms of low energy, falling asleep during the day, and low libido. When he saw the PSA jump up he opted not to give himself the testosterone shot this past Saturday.  He and his wife have Alpha-gal syndrome.  PSA Trend: 09/16/2017      2.03 01/02/2022    2.35 04/23/2023  4.88 06/01/2023    5.2  PMH: Past Medical History:  Diagnosis Date   Bell's palsy    GERD (gastroesophageal reflux disease)    Headache     Surgical History: Past Surgical History:  Procedure Laterality Date   BACK SURGERY  1997   disk surgery in lower back    CHOLECYSTECTOMY     left kidney donatioin Left    MEDIAL COLLATERAL LIGAMENT REPAIR, KNEE  2009    Home Medications:  Allergies as of 06/05/2023       Reactions   Cetirizine Other (See Comments)   Ibuprofen Other (See Comments)   Donated kidney in 2015.  Unable to take Ibuprofen.   Sulfa Antibiotics    Hives, itching    Acetaminophen-codeine Rash        Medication List        Accurate as of June 05, 2023  8:23 PM. If you have any questions, ask your nurse or doctor.           STOP taking these medications    ondansetron 4 MG disintegrating tablet Commonly known as: Zofran ODT   propranolol 40 MG tablet Commonly known as: INDERAL   SUMAtriptan 50 MG tablet Commonly known as: Imitrex       TAKE these medications    aspirin 81 MG tablet Take 1 tablet (81 mg total) by mouth daily.   MULTIVITAMIN PO Take 1 tablet by mouth daily.   testosterone cypionate 200 MG/ML injection Commonly known as: DEPOTESTOSTERONE CYPIONATE 0.5 Milliliter(s) IM Once a Week   UltiCare Syringe 22G X 1-1/2" 1 ML Misc Generic drug: Syringe/Needle (Disp) Use 1 each every 14 (fourteen) days   valsartan 40 MG tablet Commonly known as: DIOVAN Take 40 mg by mouth daily.        Allergies:  Allergies  Allergen Reactions   Cetirizine Other (See Comments)   Ibuprofen Other (See Comments)    Donated kidney in 2015.  Unable to take Ibuprofen.   Sulfa Antibiotics     Hives, itching    Acetaminophen-Codeine Rash    Family History: Family History  Problem Relation Age of Onset   Stroke Mother    Heart disease Father    Heart  attack Father    COPD Father     Social History:  reports that he quit smoking about 41 years ago. He has never used smokeless tobacco. He reports current alcohol use. He reports that he does not use drugs.   Physical Exam: There were no vitals taken for this visit.  Constitutional:  Alert and oriented, No acute distress. HEENT: [place] AT, moist mucus membranes.  Trachea midline, no masses. GU: Slightly enlarged prostate. Rubbery nodule in left base.  Skin: No rashes, bruises or suspicious lesions. Neurologic: Grossly intact, no focal deficits, moving all 4 extremities. Psychiatric: Normal mood and affect.   Assessment & Plan:    1. Elevated PSA  -  We reviewed the implications of an elevated PSA and the uncertainty surrounding it. In general, a man's PSA increases with age and is produced by both normal and cancerous prostate tissue.  Differential for elevated PSA is BPH, prostate cancer, infection, recent intercourse/ejaculation, prostate infarction, recent urethroscopic manipulation (foley placement/cystoscopy) and prostatitis. Management of an elevated PSA can include observation or prostate biopsy and we discussed this in detail.  - We discussed that indications for prostate biopsy are defined by age and race specific PSA cutoffs as well as a PSA velocity of 0.75/year. We discussed prostate biopsy in detail including the procedure itself, the risks of blood in the urine, stool, and ejaculate, serious infection, and discomfort. He is willing to proceed with this as discussed.  - Suggest stopping the testosterone and rechecking the PSA again in 3 months. At that time will evaluate if moving forward with biopsy is needed, or an alternative to the testosterone to help his symptoms.    Return in about 3 months (around 09/03/2023) for follow up .  [facility] [address] [place], [place] [address] [phone]

## 2023-08-27 DIAGNOSIS — H353131 Nonexudative age-related macular degeneration, bilateral, early dry stage: Secondary | ICD-10-CM | POA: Diagnosis not present

## 2023-09-02 ENCOUNTER — Other Ambulatory Visit (INDEPENDENT_AMBULATORY_CARE_PROVIDER_SITE_OTHER): Payer: Self-pay

## 2023-09-02 ENCOUNTER — Ambulatory Visit: Admitting: Orthopaedic Surgery

## 2023-09-02 ENCOUNTER — Telehealth: Payer: Self-pay

## 2023-09-02 ENCOUNTER — Telehealth: Payer: Self-pay | Admitting: Orthopaedic Surgery

## 2023-09-02 DIAGNOSIS — M25561 Pain in right knee: Secondary | ICD-10-CM

## 2023-09-02 DIAGNOSIS — G8929 Other chronic pain: Secondary | ICD-10-CM | POA: Diagnosis not present

## 2023-09-02 MED ORDER — LIDOCAINE HCL 1 % IJ SOLN
3.0000 mL | INTRAMUSCULAR | Status: AC | PRN
  Administered 2023-09-02: 3 mL

## 2023-09-02 MED ORDER — METHYLPREDNISOLONE ACETATE 40 MG/ML IJ SUSP
40.0000 mg | INTRAMUSCULAR | Status: AC | PRN
  Administered 2023-09-02: 40 mg via INTRA_ARTICULAR

## 2023-09-02 NOTE — Progress Notes (Signed)
 The patient is well-known to us .  He is a very active 63 year old gentleman who has been dealing with on and off chronic right knee pain and swelling for some time now.  Recently had a really flared up after working on his knees and is why made this appointment.  Things are calming down.  However he still having some daily pain and aching with no locking and catching.  We did place a steroid injection in his knee almost exactly 2 years ago and that is helped for a long period of time.  He would like to consider another injection today.  He is a thin and active individual and denies any changes in medical status.  He is not diabetic.  Examination of his right knee does show moderate effusion.  He has pain past 90 degrees of flexion with that knee.  It is ligamentously stable and his McMurray's is negative.  Plain films of the right knee show just slight medial joint space narrowing when compared to previous films from 2 years ago and there are calcifications around his medial meniscus.  I was able to aspirate 40 cc of clear fluid from his knee and then placed a steroid injection in his right knee.  There is certainly mild to moderate osteoarthritis recommending hyaluronic acid at this point for long-term pain control of the osteoarthritis aspect of his knee.  He agrees with trying this treatment plan.  This patient is diagnosed with osteoarthritis of the knee(s).    Radiographs show evidence of joint space narrowing, osteophytes, subchondral sclerosis and/or subchondral cysts.  This patient has knee pain which interferes with functional and activities of daily living.    This patient has experienced inadequate response, adverse effects and/or intolerance with conservative treatments such as acetaminophen , NSAIDS, topical creams, physical therapy or regular exercise, knee bracing and/or weight loss.   This patient has experienced inadequate response or has a contraindication to intra articular steroid  injections for at least 3 months.   This patient is not scheduled to have a total knee replacement within 6 months of starting treatment with viscosupplementation.    Procedure Note  Patient: Martin Hodge             Date of Birth: Jul 07, 1960           MRN: [id number]             Visit Date: 09/02/2023  Procedures: Visit Diagnoses:  1. Chronic pain of right knee     Large Joint Inj: R knee on 09/02/2023 1:46 PM Indications: diagnostic evaluation and pain Details: 22 G 1.5 in needle, superolateral approach  Arthrogram: No  Medications: 3 mL lidocaine  1 %; 40 mg methylPREDNISolone  acetate 40 MG/ML Outcome: tolerated well, no immediate complications Procedure, treatment alternatives, risks and benefits explained, specific risks discussed. Consent was given by the patient. Immediately prior to procedure a time out was called to verify the correct patient, procedure, equipment, support staff and site/side marked as required. Patient was prepped and draped in the usual sterile fashion.

## 2023-09-02 NOTE — Telephone Encounter (Signed)
 Patient called and said he is allergic to mammal products so if the gel shot has it he can't. CB#[phone]

## 2023-09-02 NOTE — Telephone Encounter (Signed)
Bilateral gel injections  

## 2023-09-03 ENCOUNTER — Other Ambulatory Visit
Admission: RE | Admit: 2023-09-03 | Discharge: 2023-09-03 | Disposition: A | Discharge status: Home / Self Care | Attending: Urology | Admitting: Urology

## 2023-09-03 DIAGNOSIS — R7989 Other specified abnormal findings of blood chemistry: Secondary | ICD-10-CM | POA: Diagnosis not present

## 2023-09-03 DIAGNOSIS — R972 Elevated prostate specific antigen [PSA]: Secondary | ICD-10-CM | POA: Insufficient documentation

## 2023-09-03 DIAGNOSIS — E290 Testicular hyperfunction: Secondary | ICD-10-CM | POA: Diagnosis not present

## 2023-09-03 LAB — PSA: Prostatic Specific Antigen: 3.54 ng/mL (ref 0.00–4.00)

## 2023-09-03 LAB — HEMOGLOBIN AND HEMATOCRIT, BLOOD
HCT: 43.1 % (ref 39.0–52.0)
Hemoglobin: 14.8 g/dL (ref 13.0–17.0)

## 2023-09-03 NOTE — Telephone Encounter (Signed)
 Correct and his insurance would require him to have Monovisc.  I want to check with Jenette Mitchell first just to make sure.  Jenette Mitchell, please see message below.  Just wanted to make sure before I submit.  Thank you

## 2023-09-04 LAB — TESTOSTERONE: Testosterone: 417 ng/dL (ref 264–916)

## 2023-09-04 NOTE — Telephone Encounter (Signed)
 Noted! Thank you

## 2023-09-04 NOTE — Telephone Encounter (Signed)
 VOB submitted for Monovisc, bilateral knee

## 2023-09-07 ENCOUNTER — Ambulatory Visit: Payer: BC Managed Care – PPO | Admitting: Urology

## 2023-09-08 ENCOUNTER — Ambulatory Visit: Payer: Self-pay | Admitting: Urology

## 2023-09-10 ENCOUNTER — Telehealth: Payer: Self-pay | Admitting: Orthopaedic Surgery

## 2023-09-10 ENCOUNTER — Other Ambulatory Visit: Payer: Self-pay

## 2023-09-10 DIAGNOSIS — G8929 Other chronic pain: Secondary | ICD-10-CM

## 2023-09-10 NOTE — Telephone Encounter (Signed)
 Talked with patient and advised him that his insurance [address] [employer] [place] does not cover any gel injections.  Voiced that he understands and stated that knees are feeling okay.   Patient is able to try TriVisc.

## 2023-09-10 NOTE — Telephone Encounter (Signed)
 Patient called and said that he was waiting on approval for the gel shot, also he is allergic to any mammal products so If it has it he can't do it . CB#[phone]

## 2023-09-18 ENCOUNTER — Encounter: Payer: Self-pay | Admitting: Urology

## 2023-09-18 ENCOUNTER — Ambulatory Visit: Admitting: Urology

## 2023-09-18 VITALS — BP 127/87 | HR 74

## 2023-09-18 DIAGNOSIS — R972 Elevated prostate specific antigen [PSA]: Secondary | ICD-10-CM | POA: Diagnosis not present

## 2023-09-18 DIAGNOSIS — R7989 Other specified abnormal findings of blood chemistry: Secondary | ICD-10-CM

## 2023-09-18 NOTE — Progress Notes (Signed)
 09/18/2023 12:28 PM   Martin Hodge [birth year] [id number]  Referring provider: Lyle San, MD [address] [place],  [place] [address]  Urological history: 1. Elevated PSA  - PSA (05/2023 - 5.2  2. BPH with LUTS   Chief Complaint  Patient presents with   Follow-up    HPI: Martin Hodge is a 63 y.o. man who presents today for discussion of his recent blood work.    Previous records reviewed.   He was seen by Dr. Ace Holder in January after was found his PSA raised to 5.2 from 2.35 back in August 2023.  He had been on testosterone  therapy for about 4 months as he was having symptoms of low energy, falling asleep during the day and a low libido.  When his PSA jumped up he discontinued his testosterone  therapy.  He has not yet resumed testosterone  therapy.  His testosterone  returned at 417 after being off his TRT since January making him eugonadal.   He states he feels tired after being off his testosterone  replacement therapy.    I PSS 8/2  He continues to have a split urinary stream and urinary hesitancy which has been going on for several months.  Patient denies any modifying or aggravating factors.  Patient denies any recent UTI's, gross hematuria, dysuria or suprapubic/flank pain.  Patient denies any fevers, chills, nausea or vomiting.     IPSS     Row Name 09/18/23 0800         International Prostate Symptom Score   How often have you had the sensation of not emptying your bladder? Not at All     How often have you had to urinate less than every two hours? Not at All     How often have you found you stopped and started again several times when you urinated? About half the time     How often have you found it difficult to postpone urination? Not at All     How often have you had a weak urinary stream? About half the time     How often have you had to strain to start urination? Less than 1 in 5 times     How many times did you typically get up  at night to urinate? 1 Time     Total IPSS Score 8       Quality of Life due to urinary symptoms   If you were to spend the rest of your life with your urinary condition just the way it is now how would you feel about that? Mostly Satisfied              Score:  1-7 Mild 8-19 Moderate 20-35 Severe   SHIM 22  He is experiencing a low libido.  Patient still having spontaneous erections.  He denies any pain or curvature with erections.     SHIM     Row Name 09/18/23 [phone]         SHIM: Over the last 6 months:   How do you rate your confidence that you could get and keep an erection? High     When you had erections with sexual stimulation, how often were your erections hard enough for penetration (entering your partner)? Almost Always or Always     During sexual intercourse, how often were you able to maintain your erection after you had penetrated (entered) your partner? Almost Always or Always     During sexual  intercourse, how difficult was it to maintain your erection to completion of intercourse? Slightly Difficult     When you attempted sexual intercourse, how often was it satisfactory for you? Most Times (much more than half the time)       SHIM Total Score   SHIM 22              Score: 1-7 Severe ED 8-11 Moderate ED 12-16 Mild-Moderate ED 17-21 Mild ED 22-25 No ED   PMH: Past Medical History:  Diagnosis Date   Bell's palsy    GERD (gastroesophageal reflux disease)    Headache     Surgical History: Past Surgical History:  Procedure Laterality Date   BACK SURGERY  1997   disk surgery in lower back    CHOLECYSTECTOMY     left kidney donatioin Left    MEDIAL COLLATERAL LIGAMENT REPAIR, KNEE  2009    Home Medications:  Allergies as of 09/18/2023       Reactions   Sulfur Dioxide Hives   Cetirizine Other (See Comments)   Ibuprofen Other (See Comments)   Donated kidney in 2015.  Unable to take Ibuprofen.   Sulfa Antibiotics    Hives, itching     Acetaminophen -codeine Rash        Medication List        Accurate as of Sep 18, 2023 12:28 PM. If you have any questions, ask your nurse or doctor.          aspirin  81 MG tablet Take 1 tablet (81 mg total) by mouth daily.   MULTIVITAMIN PO Take 1 tablet by mouth daily.   testosterone  cypionate 200 MG/ML injection Commonly known as: DEPOTESTOSTERONE CYPIONATE 0.5 Milliliter(s) IM Once a Week   UltiCare Syringe 22G X 1-1/2" 1 ML Misc Generic drug: Syringe/Needle (Disp) Use 1 each every 14 (fourteen) days   valsartan 40 MG tablet Commonly known as: DIOVAN Take 40 mg by mouth daily.        Allergies:  Allergies  Allergen Reactions   Sulfur Dioxide Hives   Cetirizine Other (See Comments)   Ibuprofen Other (See Comments)    Donated kidney in 2015.  Unable to take Ibuprofen.   Sulfa Antibiotics     Hives, itching    Acetaminophen -Codeine Rash    Family History: Family History  Problem Relation Age of Onset   Stroke Mother    Heart disease Father    Heart attack Father    COPD Father     Social History:  reports that he quit smoking about 41 years ago. He has never used smokeless tobacco. He reports current alcohol use. He reports that he does not use drugs.  ROS: Pertinent ROS in HPI  Physical Exam: BP 127/87   Pulse 74   Constitutional:  Well nourished. Alert and oriented, No acute distress. HEENT: [place] AT, moist mucus membranes.  Trachea midline Cardiovascular: No clubbing, cyanosis, or edema. Respiratory: Normal respiratory effort, no increased work of breathing. Rectal: Patient with  normal sphincter tone. Anus and perineum without scarring or rashes. No rectal masses are appreciated. Prostate is approximately 50 + grams, could not palpate the entire gland due to size and rubbery nodule in the left base.   nodules are appreciated. Seminal vesicles could not be palpated.  Neurologic: Grossly intact, no focal deficits, moving all 4  extremities. Psychiatric: Normal mood and affect.  Laboratory Data: Results for orders placed or performed during the hospital encounter of 09/03/23  PSA   Collection  Time: 09/03/23  8:41 AM  Result Value Ref Range   Prostatic Specific Antigen 3.54 0.00 - 4.00 ng/mL  Hemoglobin and hematocrit, blood   Collection Time: 09/03/23  8:41 AM  Result Value Ref Range   Hemoglobin 14.8 13.0 - 17.0 g/dL   HCT 16.1 09.6 - 04.5 %  Testosterone    Collection Time: 09/03/23  8:41 AM  Result Value Ref Range   Testosterone  417 264 - 916 ng/dL  I have reviewed the labs.   Pertinent Imaging: N/A  Assessment & Plan:    1. Elevated PSA - PSA is returned within the normal range, but like to make sure that he does not stabilize since he has been off the testosterone  and we will repeat it again in 3 months to ensure it does not increase again  2. Fatigue - Discussed the possibility of him having sleep apnea as he states he can fall asleep during the day if he sits still and encouraged him to reach out to his primary care physician to be evaluated for this -I have also explained that he is eugonadal and TRT is not indicated at this time to treat his symptoms of fatigue  Return in about 3 months (around 12/19/2023) for PSA only .  These notes generated with voice recognition software. I apologize for typographical errors.  Briant Camper  [facility] [address]  [address] [place], [place] [address] [phone]

## 2023-10-19 DIAGNOSIS — L821 Other seborrheic keratosis: Secondary | ICD-10-CM | POA: Diagnosis not present

## 2023-10-19 DIAGNOSIS — D229 Melanocytic nevi, unspecified: Secondary | ICD-10-CM | POA: Diagnosis not present

## 2023-10-19 DIAGNOSIS — L814 Other melanin hyperpigmentation: Secondary | ICD-10-CM | POA: Diagnosis not present

## 2023-10-19 DIAGNOSIS — L308 Other specified dermatitis: Secondary | ICD-10-CM | POA: Diagnosis not present

## 2023-10-28 ENCOUNTER — Ambulatory Visit: Payer: Self-pay

## 2023-10-28 DIAGNOSIS — Z1211 Encounter for screening for malignant neoplasm of colon: Secondary | ICD-10-CM | POA: Diagnosis not present

## 2023-10-28 DIAGNOSIS — Z83719 Family history of colon polyps, unspecified: Secondary | ICD-10-CM | POA: Diagnosis not present

## 2023-10-28 DIAGNOSIS — K635 Polyp of colon: Secondary | ICD-10-CM | POA: Diagnosis not present

## 2023-10-28 DIAGNOSIS — K5730 Diverticulosis of large intestine without perforation or abscess without bleeding: Secondary | ICD-10-CM | POA: Diagnosis not present

## 2023-12-18 ENCOUNTER — Other Ambulatory Visit

## 2023-12-18 DIAGNOSIS — R7989 Other specified abnormal findings of blood chemistry: Secondary | ICD-10-CM | POA: Diagnosis not present

## 2023-12-18 DIAGNOSIS — R972 Elevated prostate specific antigen [PSA]: Secondary | ICD-10-CM

## 2023-12-19 LAB — PSA: Prostate Specific Ag, Serum: 3.3 ng/mL (ref 0.0–4.0)

## 2023-12-20 ENCOUNTER — Ambulatory Visit: Payer: Self-pay | Admitting: Urology

## 2023-12-29 NOTE — Telephone Encounter (Signed)
 Mr. Martin Hodge did not read his MyChart message.   Would you call him and  get him scheduled for  a 6  month follow up.     Mr. Martin Hodge, Your PSA has remained relatively stable at 3.3.  We should continue to keep a closer eye on it as it was elevated at one time.  I would like to see you back in the office in 6 months for repeat PSA and prostate exam.  It is important to keep an eye on the PSA as this may be the only indication of any developing prostate cancer.   Please call the office [phone]) or request an appointment through MyChart.    Derald Lorge, PA-C

## 2023-12-29 NOTE — Progress Notes (Signed)
Called patient and patient understood

## 2024-02-04 DIAGNOSIS — N1831 Chronic kidney disease, stage 3a: Secondary | ICD-10-CM | POA: Diagnosis not present

## 2024-02-04 DIAGNOSIS — R5383 Other fatigue: Secondary | ICD-10-CM | POA: Diagnosis not present

## 2024-02-04 DIAGNOSIS — Z0000 Encounter for general adult medical examination without abnormal findings: Secondary | ICD-10-CM | POA: Diagnosis not present

## 2024-02-04 DIAGNOSIS — Z91014 Allergy to mammalian meats: Secondary | ICD-10-CM | POA: Diagnosis not present

## 2024-03-14 ENCOUNTER — Encounter: Payer: Self-pay | Admitting: Radiology

## 2024-05-30 NOTE — Progress Notes (Unsigned)
 "   05/31/2024 9:18 AM   Norleen JONETTA Heather 1961-01-28 [id number]  Referring provider: Valora Lynwood FALCON, MD [address] [place],  [place] [address]  Urological history: 1. Elevated PSA  - PSA (05/2023) - 5.2  2. BPH with LU TS - PSA pending   3. Solitary kidney - donated left kidney (2016)   Chief Complaint  Patient presents with   Elevated PSA   HPI: Martin Hodge is a 64 y.o. man who presents today for 6 month follow up for history of elevated PSA.   Previous records reviewed.   I PSS 11/2  He reports sensation of incomplete bladder emptying, urinary frequency, urinary intermittency, urinary urgency, and a weak urinary stream.  He is wondering if he can take tamsulosin  for his symptoms.   Patient denies any modifying or aggravating factors.  Patient denies any recent UTI's, gross hematuria, dysuria or suprapubic/flank pain.  Patient denies any fevers, chills, nausea or vomiting.    PSA pending   PMH: Past Medical History:  Diagnosis Date   Bell's palsy    GERD (gastroesophageal reflux disease)    Headache     Surgical History: Past Surgical History:  Procedure Laterality Date   BACK SURGERY  1997   disk surgery in lower back    CHOLECYSTECTOMY     left kidney donatioin Left    MEDIAL COLLATERAL LIGAMENT REPAIR, KNEE  2009    Home Medications:  Allergies as of 05/31/2024       Reactions   Alpha-gal Dermatitis, Diarrhea, Hives, Other (See Comments)   Sulfur Dioxide Hives   Cetirizine Other (See Comments)   Ibuprofen Other (See Comments)   Donated kidney in 2015.  Unable to take Ibuprofen.   Sulfa Antibiotics    Hives, itching    Acetaminophen -codeine Rash        Medication List        Accurate as of May 31, 2024  9:18 AM. If you have any questions, ask your nurse or doctor.          aspirin  81 MG tablet Take 1 tablet (81 mg total) by mouth daily.   MULTIVITAMIN PO Take 1 tablet by mouth daily.   tamsulosin  0.4 MG  Caps capsule Commonly known as: FLOMAX  Take 1 capsule (0.4 mg total) by mouth daily.   testosterone  cypionate 200 MG/ML injection Commonly known as: DEPOTESTOSTERONE CYPIONATE 0.5 Milliliter(s) IM Once a Week   UltiCare Syringe 22G X 1-1/2 1 ML Misc Generic drug: Syringe/Needle (Disp) Use 1 each every 14 (fourteen) days   valsartan 40 MG tablet Commonly known as: DIOVAN Take 40 mg by mouth daily.        Allergies:  Allergies  Allergen Reactions   Alpha-Gal Dermatitis, Diarrhea, Hives and Other (See Comments)   Sulfur Dioxide Hives   Cetirizine Other (See Comments)   Ibuprofen Other (See Comments)    Donated kidney in 2015.  Unable to take Ibuprofen.   Sulfa Antibiotics     Hives, itching    Acetaminophen -Codeine Rash    Family History: Family History  Problem Relation Age of Onset   Stroke Mother    Heart disease Father    Heart attack Father    COPD Father     Social History:  reports that he quit smoking about 42 years ago. His smoking use included cigarettes. He has never used smokeless tobacco. He reports current alcohol use. He reports that he does not use drugs.  ROS:  Pertinent ROS in HPI  Physical Exam: BP (!) 136/92   Pulse 76   Wt 195 lb (88.5 kg)   SpO2 99%   BMI 28.80 kg/m   Constitutional:  Well nourished. Alert and oriented, No acute distress. HEENT: [place] AT, moist mucus membranes.  Trachea midline Cardiovascular: No clubbing, cyanosis, or edema. Respiratory: Normal respiratory effort, no increased work of breathing. Neurologic: Grossly intact, no focal deficits, moving all 4 extremities. Psychiatric: Normal mood and affect.   Laboratory Data: See Epic and HPI   I have reviewed the labs.   Pertinent Imaging: N/A  Assessment & Plan:    1. BPH with LU TS - moderate severe symptoms and he is mostly satisfied with his urinary condition - PSA pending  - most bothersome symptoms are weak urinary stream  - encouraged avoiding bladder  irritants, fluid restriction before bedtime and timed voiding's - Initiate alpha-blocker (tamsulosin  0.4 mg) - he has alpha gal and will need to ask the pharmacist if there is any gelatin in the tamsulosin  capsule, if the pharmacy is manufacture lists gelatin, I also will have him ask if tadalafil would be a good alternative, if so, he will contact us  and we will send in a prescription for tadalafil 5 mg daily  2. Solitary kidney - donated left kidney (2016) - Renal function stable per most recent labs.  - Patient asymptomatic.  - Reinforced importance of hydration, avoidance of nephrotoxic medications, and regular renal monitoring.   Return for Follow up pending labs.  These notes generated with voice recognition software. I apologize for typographical errors.  CLOTILDA HELON RIGGERS  [facility] [address]  [address] [place], [place] [address] [phone]  "

## 2024-05-31 ENCOUNTER — Ambulatory Visit: Admitting: Urology

## 2024-05-31 ENCOUNTER — Encounter: Payer: Self-pay | Admitting: Urology

## 2024-05-31 VITALS — BP 136/92 | HR 76 | Wt 195.0 lb

## 2024-05-31 DIAGNOSIS — N401 Enlarged prostate with lower urinary tract symptoms: Secondary | ICD-10-CM | POA: Diagnosis not present

## 2024-05-31 DIAGNOSIS — Z905 Acquired absence of kidney: Secondary | ICD-10-CM

## 2024-05-31 DIAGNOSIS — N138 Other obstructive and reflux uropathy: Secondary | ICD-10-CM

## 2024-05-31 MED ORDER — TAMSULOSIN HCL 0.4 MG PO CAPS: 0.4000 mg | ORAL_CAPSULE | Freq: Every day | ORAL | 3 refills | Status: AC

## 2024-05-31 NOTE — Patient Instructions (Signed)
 Tadalafi is the other medication

## 2024-06-02 ENCOUNTER — Ambulatory Visit: Payer: Self-pay | Admitting: Urology

## 2024-06-02 LAB — PROSTATE HEALTH INDEX: Prostate Specific Ag: 2 ng/mL (ref 0.0–3.9)

## 2024-06-02 LAB — REFLEX INFORMATION
